# Patient Record
Sex: Female | Born: 2000 | Race: Asian | Hispanic: No | Marital: Married | Smoking: Never smoker
Health system: Southern US, Community
[De-identification: ages and names within clinical notes are randomized; demographics above are authoritative.]

## PROBLEM LIST (undated history)

## (undated) DIAGNOSIS — E538 Deficiency of other specified B group vitamins: Secondary | ICD-10-CM

## (undated) DIAGNOSIS — Z789 Other specified health status: Secondary | ICD-10-CM

## (undated) HISTORY — PX: NO PAST SURGERIES: SHX2092

---

## 2021-02-14 ENCOUNTER — Ambulatory Visit (HOSPITAL_COMMUNITY)
Admission: RE | Admit: 2021-02-14 | Discharge: 2021-02-14 | Disposition: A | Discharge status: Home / Self Care | Payer: 59 | Source: Ambulatory Visit | Attending: Emergency Medicine | Admitting: Emergency Medicine

## 2021-02-14 ENCOUNTER — Other Ambulatory Visit: Payer: Self-pay

## 2021-02-14 ENCOUNTER — Encounter (HOSPITAL_COMMUNITY): Payer: Self-pay

## 2021-02-14 VITALS — BP 119/76 | HR 93 | Temp 98.8°F | Resp 17

## 2021-02-14 DIAGNOSIS — Z3201 Encounter for pregnancy test, result positive: Secondary | ICD-10-CM

## 2021-02-14 LAB — POCT URINALYSIS DIPSTICK, ED / UC
Bilirubin Urine: NEGATIVE
Glucose, UA: NEGATIVE mg/dL
Hgb urine dipstick: NEGATIVE
Ketones, ur: NEGATIVE mg/dL
Leukocytes,Ua: NEGATIVE
Nitrite: NEGATIVE
Protein, ur: NEGATIVE mg/dL
Specific Gravity, Urine: <=1.005 (ref 1.005–1.030)
Urobilinogen, UA: 0.2 mg/dL (ref 0.0–1.0)
pH: 5 (ref 5.0–8.0)

## 2021-02-14 LAB — POC URINE PREG, ED: Preg Test, Ur: POSITIVE — AB

## 2021-02-14 MED ORDER — PRENATAL COMPLETE 14-0.4 MG PO TABS: 1.0000 | ORAL_TABLET | Freq: Every day | ORAL | 0 refills | Status: DC

## 2021-02-14 NOTE — ED Triage Notes (Signed)
Pt reports that when she urinates has some bubbles that will last about 5-6 seconds and go away. Thinks might have protein in urine. Has some odor. Reports trying to conceive. Denies dysuria or blood in urine. Has little abd cramping.  Having trouble sleeping at night also

## 2021-02-14 NOTE — Discharge Instructions (Addendum)
Take the prenatal vitamin daily.   Do not take any ibuprofen (advil), naproxen (aleve), or aspirin.   Follow up with an OB/GYN as soon as possible.  They will likely schedule an appointment for around 8 weeks.

## 2021-02-14 NOTE — ED Provider Notes (Signed)
[facility]    CSN: [id number] Arrival date & time: 02/14/21  1656      History   Chief Complaint Chief Complaint  Patient presents with   appointment 1700    HPI Yolanda Valdez is a 20 y.o. female.   Patient here for evaluation due to concern for possible UTI.  Reports noticing some bubbles in urine as well as an odor.  Denies any teary, urgency, or frequency.  Denies any hematuria.  Also reports trying to conceive and last menstrual period was September 21.  Denies any trauma, injury, or other precipitating event.  Denies any specific alleviating or aggravating factors.  Denies any fevers, chest pain, shortness of breath, N/V/D, numbness, tingling, weakness, abdominal pain, or headaches.    The history is provided by the patient and the spouse.   History reviewed. No pertinent past medical history.  There are no problems to display for this patient.   History reviewed. No pertinent surgical history.  OB History   No obstetric history on file.      Home Medications    Prior to Admission medications   Medication Sig Start Date End Date Taking? Authorizing Provider  Prenatal Vit-Fe Fumarate-FA (PRENATAL COMPLETE) 14-0.4 MG TABS Take 1 tablet by mouth daily. 02/14/21  Yes Ivette Loyal, NP    Family History No family history on file.  Social History Social History   Tobacco Use   Smoking status: Never   Smokeless tobacco: Never  Substance Use Topics   Alcohol use: Not Currently   Drug use: Not Currently     Allergies   Patient has no known allergies.   Review of Systems Review of Systems  All other systems reviewed and are negative.   Physical Exam Triage Vital Signs ED Triage Vitals  Enc Vitals Group     BP 02/14/21 1720 119/76     Pulse Rate 02/14/21 1720 93     Resp 02/14/21 1720 17     Temp 02/14/21 1720 98.8 F (37.1 C)     Temp Source 02/14/21 1720 Oral     SpO2 02/14/21 1720 100 %     Weight --      Height --      Head  Circumference --      Peak Flow --      Pain Score 02/14/21 1717 4     Pain Loc --      Pain Edu? --      Excl. in GC? --    No data found.  Updated Vital Signs BP 119/76 (BP Location: Right Arm)   Pulse 93   Temp 98.8 F (37.1 C) (Oral)   Resp 17   LMP 01/12/2021   SpO2 100%   Visual Acuity Right Eye Distance:   Left Eye Distance:   Bilateral Distance:    Right Eye Near:   Left Eye Near:    Bilateral Near:     Physical Exam Vitals and nursing note reviewed.  Constitutional:      General: She is not in acute distress.    Appearance: Normal appearance. She is not ill-appearing, toxic-appearing or diaphoretic.  HENT:     Head: Normocephalic and atraumatic.  Eyes:     Conjunctiva/sclera: Conjunctivae normal.  Cardiovascular:     Rate and Rhythm: Normal rate.     Pulses: Normal pulses.  Pulmonary:     Effort: Pulmonary effort is normal.  Abdominal:     General: Abdomen is flat.  Tenderness: There is no right CVA tenderness or left CVA tenderness.  Musculoskeletal:        General: Normal range of motion.     Cervical back: Normal range of motion.  Skin:    General: Skin is warm and dry.  Neurological:     General: No focal deficit present.     Mental Status: She is alert and oriented to person, place, and time.  Psychiatric:        Mood and Affect: Mood normal.     UC Treatments / Results  Labs (all labs ordered are listed, but only abnormal results are displayed) Labs Reviewed  POC URINE PREG, ED - Abnormal; Notable for the following components:      Result Value   Preg Test, Ur POSITIVE (*)    All other components within normal limits  POCT URINALYSIS DIPSTICK, ED / UC    EKG   Radiology No results found.  Procedures Procedures (including critical care time)  Medications Ordered in UC Medications - No data to display  Initial Impression / Assessment and Plan / UC Course  I have reviewed the triage vital signs and the nursing  notes.  Pertinent labs & imaging results that were available during my care of the patient were reviewed by me and considered in my medical decision making (see chart for details).    Assessment negative for red flags or concerns.  Urinalysis within normal limits with no signs of infection.  Urine pregnancy test positive.  Prescribed daily prenatal vitamins.  Patient given sheet on medications that are safe to take during pregnancy.  Follow-up with OB/GYN as soon as possible for reevaluation. Final Clinical Impressions(s) / UC Diagnoses   Final diagnoses:  Positive pregnancy test     Discharge Instructions      Take the prenatal vitamin daily.   Do not take any ibuprofen (advil), naproxen (aleve), or aspirin.   Follow up with an OB/GYN as soon as possible.  They will likely schedule an appointment for around 8 weeks.       ED Prescriptions     Medication Sig Dispense Auth. Provider   Prenatal Vit-Fe Fumarate-FA (PRENATAL COMPLETE) 14-0.4 MG TABS Take 1 tablet by mouth daily. 60 tablet Ivette Loyal, NP      PDMP not reviewed this encounter.   Ivette Loyal, NP 02/14/21 1754

## 2021-03-15 ENCOUNTER — Ambulatory Visit (INDEPENDENT_AMBULATORY_CARE_PROVIDER_SITE_OTHER): Payer: 59

## 2021-03-15 ENCOUNTER — Other Ambulatory Visit: Payer: Self-pay

## 2021-03-15 VITALS — BP 110/65 | HR 77 | Ht 64.0 in | Wt 147.4 lb

## 2021-03-15 DIAGNOSIS — Z3400 Encounter for supervision of normal first pregnancy, unspecified trimester: Secondary | ICD-10-CM | POA: Insufficient documentation

## 2021-03-15 DIAGNOSIS — Z3401 Encounter for supervision of normal first pregnancy, first trimester: Secondary | ICD-10-CM

## 2021-03-15 DIAGNOSIS — O3680X0 Pregnancy with inconclusive fetal viability, not applicable or unspecified: Secondary | ICD-10-CM

## 2021-03-15 DIAGNOSIS — Z3A08 8 weeks gestation of pregnancy: Secondary | ICD-10-CM

## 2021-03-15 NOTE — Progress Notes (Signed)
New OB Intake  I connected with  Yolanda Valdez on 03/15/21 at  8:15 AM EST by in person and verified that I am speaking with the correct person using two identifiers. Nurse is located at [facility] and pt is located at [place].  I discussed the limitations, risks, security and privacy concerns of performing an evaluation and management service by telephone and the availability of in person appointments. I also discussed with the patient that there may be a patient responsible charge related to this service. The patient expressed understanding and agreed to proceed.  I explained I am completing New OB Intake today. We discussed her EDD of 10/19/21 that is based on LMP of 01/12/21. Pt is G1/P0. I reviewed her allergies, medications, Medical/Surgical/OB history, and appropriate screenings. I informed her of [facility] services. Based on history, this is a/an  pregnancy uncomplicated .   There are no problems to display for this patient.   Concerns addressed today  Delivery Plans:  Plans to deliver at [facility].   MyChart/Babyscripts MyChart access verified. I explained pt will have some visits in office and some virtually. Babyscripts instructions given and order placed. Patient verifies receipt of registration text/e-mail. Account successfully created and app downloaded.  Blood Pressure Cuff  Patient has private insurance; instructed to purchase blood pressure cuff and bring to first prenatal appt. Explained after first prenatal appt pt will check weekly and document in Babyscripts.  Weight scale: Patient does have weight scale at home already.  Anatomy US Explained first scheduled [place] will be around 19 weeks. Dating and viability scan performed today.  Labs Discussed Avelina Laine genetic screening with patient. Would like both Panorama and Horizon drawn at new OB visit. Routine prenatal labs needed.  Covid Vaccine Patient has covid vaccine.    Informed patient of [facility] website  and placed  link in her AVS.   Social Determinants of Health Food Insecurity: Patient denies food insecurity. WIC Referral: Patient is interested in referral to [facility].  Transportation: Patient denies transportation needs. Childcare: Discussed no children allowed at ultrasound appointments. Offered childcare services; patient declines childcare services at this time.  Send link to Pregnancy Navigators   Placed OB Box on problem list and updated  First visit review I reviewed new OB appt with pt. I explained she will have a pelvic exam, ob bloodwork with genetic screening, and PAP smear. Explained pt will be seen by Donia Ast at first visit; encounter routed to appropriate provider. Explained that patient will be seen by pregnancy navigator following visit with provider. [facility] information placed in AVS.   Hamilton Capri, RN 03/15/2021  8:13 AM

## 2021-03-22 ENCOUNTER — Ambulatory Visit (INDEPENDENT_AMBULATORY_CARE_PROVIDER_SITE_OTHER): Payer: 59 | Admitting: Family Medicine

## 2021-03-22 ENCOUNTER — Encounter: Payer: Self-pay | Admitting: Family Medicine

## 2021-03-22 ENCOUNTER — Other Ambulatory Visit: Payer: Self-pay

## 2021-03-22 VITALS — BP 94/62 | HR 83 | Temp 98.2°F | Ht 63.39 in | Wt 147.8 lb

## 2021-03-22 DIAGNOSIS — Z0000 Encounter for general adult medical examination without abnormal findings: Secondary | ICD-10-CM | POA: Diagnosis not present

## 2021-03-22 DIAGNOSIS — Z23 Encounter for immunization: Secondary | ICD-10-CM | POA: Diagnosis not present

## 2021-03-22 DIAGNOSIS — Z0001 Encounter for general adult medical examination with abnormal findings: Secondary | ICD-10-CM

## 2021-03-22 NOTE — Patient Instructions (Signed)
It was very nice to see you today!  Congratulations on your pregnancy!   We will give you a flu shot today.  We will see you back in year for your next physical.  Please come back sooner if needed.  Take care, Dr Jerline Pain  PLEASE NOTE:  If you had any lab tests please let us know if you have not heard back within a few days. You may see your results on mychart before we have a chance to review them but we will give you a call once they are reviewed by [place]. If we ordered any referrals today, please let us know if you have not heard from their office within the next week.   Please try these tips to maintain a healthy lifestyle:  Eat at least 3 REAL meals and 1-2 snacks per day.  Aim for no more than 5 hours between eating.  If you eat breakfast, please do so within one hour of getting up.   Each meal should contain half fruits/vegetables, one quarter protein, and one quarter carbs (no bigger than a computer mouse)  Cut down on sweet beverages. This includes juice, soda, and sweet tea.   Drink at least 1 glass of water with each meal and aim for at least 8 glasses per day  Exercise at least 150 minutes every week.    Preventive Care 81-48 Years Old, Female Preventive care refers to lifestyle choices and visits with your health care provider that can promote health and wellness. Preventive care visits are also called wellness exams. What can I expect for my preventive care visit? Counseling During your preventive care visit, your health care provider may ask about your: Medical history, including: Past medical problems. Family medical history. Pregnancy history. Current health, including: Menstrual cycle. Method of birth control. Emotional well-being. Home life and relationship well-being. Sexual activity and sexual health. Lifestyle, including: Alcohol, nicotine or tobacco, and drug use. Access to firearms. Diet, exercise, and sleep habits. Work and work  Statistician. Sunscreen use. Safety issues such as seatbelt and bike helmet use. Physical exam Your health care provider may check your: Height and weight. These may be used to calculate your BMI (body mass index). BMI is a measurement that tells if you are at a healthy weight. Waist circumference. This measures the distance around your waistline. This measurement also tells if you are at a healthy weight and may help predict your risk of certain diseases, such as type 2 diabetes and high blood pressure. Heart rate and blood pressure. Body temperature. Skin for abnormal spots. What immunizations do I need? Vaccines are usually given at various ages, according to a schedule. Your health care provider will recommend vaccines for you based on your age, medical history, and lifestyle or other factors, such as travel or where you work. What tests do I need? Screening Your health care provider may recommend screening tests for certain conditions. This may include: Pelvic exam and Pap test. Lipid and cholesterol levels. Diabetes screening. This is done by checking your blood sugar (glucose) after you have not eaten for a while (fasting). Hepatitis B test. Hepatitis C test. HIV (human immunodeficiency virus) test. STI (sexually transmitted infection) testing, if you are at risk. BRCA-related cancer screening. This may be done if you have a family history of breast, ovarian, tubal, or peritoneal cancers. Talk with your health care provider about your test results, treatment options, and if necessary, the need for more tests. Follow these instructions at home: Eating and  drinking  Eat a healthy diet that includes fresh fruits and vegetables, whole grains, lean protein, and low-fat dairy products. Take vitamin and mineral supplements as recommended by your health care provider. Do not drink alcohol if: Your health care provider tells you not to drink. You are pregnant, may be pregnant, or are  planning to become pregnant. If you drink alcohol: Limit how much you have to 0-1 drink a day. Know how much alcohol is in your drink. In the [place] one drink equals one 12 oz bottle of beer (355 mL), one 5 oz glass of wine (148 mL), or one 1 oz glass of hard liquor (44 mL). Lifestyle Brush your teeth every morning and night with fluoride toothpaste. Floss one time each day. Exercise for at least 30 minutes 5 or more days each week. Do not use any products that contain nicotine or tobacco. These products include cigarettes, chewing tobacco, and vaping devices, such as e-cigarettes. If you need help quitting, ask your health care provider. Do not use drugs. If you are sexually active, practice safe sex. Use a condom or other form of protection to prevent STIs. If you do not wish to become pregnant, use a form of birth control. If you plan to become pregnant, see your health care provider for a prepregnancy visit. Find healthy ways to manage stress, such as: Meditation, yoga, or listening to music. Journaling. Talking to a trusted person. Spending time with friends and family. Minimize exposure to UV radiation to reduce your risk of skin cancer. Safety Always wear your seat belt while driving or riding in a vehicle. Do not drive: If you have been drinking alcohol. Do not ride with someone who has been drinking. If you have been using any mind-altering substances or drugs. While texting. When you are tired or distracted. Wear a helmet and other protective equipment during sports activities. If you have firearms in your house, make sure you follow all gun safety procedures. Seek help if you have been physically or sexually abused. What's next? Go to your health care provider once a year for an annual wellness visit. Ask your health care provider how often you should have your eyes and teeth checked. Stay up to date on all vaccines. This information is not intended to replace advice given  to you by your health care provider. Make sure you discuss any questions you have with your health care provider. Document Revised: 10/06/2020 Document Reviewed: 10/06/2020 Elsevier Patient Education  [place].

## 2021-03-22 NOTE — Progress Notes (Signed)
Chief Complaint:  Yolanda Valdez is a 20 y.o. female who presents today for her annual comprehensive physical exam.  She is a new patient.   Assessment/Plan:  Pregnant Established with OB/GYN.  She is about [redacted] weeks pregnant.  Preventative Healthcare: Flu shot given today. She will follow up with OBGYN for prenatal and preventative healthcare.   Patient Counseling(The following topics were reviewed and/or handout was given):  -Nutrition: Stressed importance of moderation in sodium/caffeine intake, saturated fat and cholesterol, caloric balance, sufficient intake of fresh fruits, vegetables, and fiber.  -Stressed the importance of regular exercise.   -Substance Abuse: Discussed cessation/primary prevention of tobacco, alcohol, or other drug use; driving or other dangerous activities under the influence; availability of treatment for abuse.   -Injury prevention: Discussed safety belts, safety helmets, smoke detector, smoking near bedding or upholstery.   -Sexuality: Discussed sexually transmitted diseases, partner selection, use of condoms, avoidance of unintended pregnancy and contraceptive alternatives.   -Dental health: Discussed importance of regular tooth brushing, flossing, and dental visits.  -Health maintenance and immunizations reviewed. Please refer to Health maintenance section.  Return to care in 1 year for next preventative visit.     Subjective:  HPI:  She has no acute complaints today. She is a new patient  Lifestyle Diet: Balanced. Plenty of fruits and vegetables.  Exercise: REgular.   No flowsheet data found.  Health Maintenance Due  Topic Date Due   HPV VACCINES (1 - 2-dose series) Never done   HIV Screening  Never done   Hepatitis C Screening  Never done   TETANUS/TDAP  Never done   INFLUENZA VACCINE  Never done   PAP-Cervical Cytology Screening  Never done   PAP SMEAR-Modifier  Never done     ROS: Per HPI, otherwise a complete review of systems was  negative.   PMH:  The following were reviewed and entered/updated in epic: History reviewed. No pertinent past medical history. There are no problems to display for this patient.  History reviewed. No pertinent surgical history.  Family History  Problem Relation Age of Onset   Hypertension Mother    Diabetes type II Mother     Medications- reviewed and updated Current Outpatient Medications  Medication Sig Dispense Refill   Prenatal Vit-Fe Fumarate-FA (PRENATAL VITAMINS) 28-0.8 MG TABS Take by mouth.     No current facility-administered medications for this visit.    Allergies-reviewed and updated No Known Allergies  Social History   Socioeconomic History   Marital status: Married    Spouse name: Not on file   Number of children: Not on file   Years of education: Not on file   Highest education level: Not on file  Occupational History   Not on file  Tobacco Use   Smoking status: Never   Smokeless tobacco: Never  Vaping Use   Vaping Use: Never used  Substance and Sexual Activity   Alcohol use: Never   Drug use: Never   Sexual activity: Yes  Other Topics Concern   Not on file  Social History Narrative   Not on file   Social Determinants of Health   Financial Resource Strain: Not on file  Food Insecurity: Not on file  Transportation Needs: Not on file  Physical Activity: Not on file  Stress: Not on file  Social Connections: Not on file        Objective:  Physical Exam: BP 94/62   Pulse 83   Temp 98.2 F (36.8 C) (Temporal)  Ht 5' 3.39" (1.61 m)   Wt 147 lb 12.8 oz (67 kg)   SpO2 98%   BMI 25.86 kg/m   Body mass index is 25.86 kg/m. Wt Readings from Last 3 Encounters:  03/22/21 147 lb 12.8 oz (67 kg)   Gen: NAD, resting comfortably HEENT: TMs normal bilaterally. OP clear. No thyromegaly noted.  CV: RRR with no murmurs appreciated Pulm: NWOB, CTAB with no crackles, wheezes, or rhonchi GI: Normal bowel sounds present. Soft, Nontender,  Nondistended. MSK: no edema, cyanosis, or clubbing noted Skin: warm, dry Neuro: CN2-12 grossly intact. Strength 5/5 in upper and lower extremities. Reflexes symmetric and intact bilaterally.  Psych: Normal affect and thought content     Yolanda Valdez M. [name], MD 03/22/2021 10:43 AM

## 2021-03-23 ENCOUNTER — Encounter: Payer: Self-pay | Admitting: Family Medicine

## 2021-03-30 ENCOUNTER — Ambulatory Visit (INDEPENDENT_AMBULATORY_CARE_PROVIDER_SITE_OTHER): Payer: 59 | Admitting: Family Medicine

## 2021-03-30 ENCOUNTER — Encounter: Payer: Self-pay | Admitting: Family Medicine

## 2021-03-30 ENCOUNTER — Other Ambulatory Visit: Payer: Self-pay

## 2021-03-30 ENCOUNTER — Other Ambulatory Visit (HOSPITAL_COMMUNITY)
Admission: RE | Admit: 2021-03-30 | Discharge: 2021-03-30 | Disposition: A | Discharge status: Home / Self Care | Payer: 59 | Source: Ambulatory Visit | Attending: Women's Health | Admitting: Women's Health

## 2021-03-30 VITALS — BP 119/67 | HR 71 | Wt 148.0 lb

## 2021-03-30 DIAGNOSIS — Z3401 Encounter for supervision of normal first pregnancy, first trimester: Secondary | ICD-10-CM | POA: Diagnosis present

## 2021-03-30 NOTE — Progress Notes (Signed)
Subjective:   Yolanda Valdez is a 20 y.o. G1P0000 at [redacted]w[redacted]d by LMP being seen today for her first obstetrical visit.  She has no significant obstetric history thus far. She denies any chronic medical problems and is not taking any other medications outside of her prenatal vitamin and Unisom. She has not had any prior surgeries. She reports having normal periods prior to pregnancy. Patient does intend to breast feed.    Patient reports some low back aches and leg cramps recently. She is wanting advice on how to help with this today. She reports some mild nausea but this is mild and intermittent. She denies dysuria, vaginal bleeding, pelvic pain, or LOF. She denies abnormal vaginal discharge. She is eating and drinking well. Her and her partner are very excited about her pregnancy. They are requesting genetic testing to hopefully find out the gender of the baby early.  HISTORY: OB History  Gravida Para Term Preterm AB Living  1 0 0 0 0 0  SAB IAB Ectopic Multiple Live Births  0 0 0 0 0    # Outcome Date GA Lbr Len/2nd Weight Sex Delivery Anes PTL Lv  1 Current            Pap smear: Never completed; will complete today.  History reviewed. No pertinent past medical history.  History reviewed. No pertinent surgical history.  Family History  Problem Relation Age of Onset   Hypertension Mother    Diabetes type II Mother    Diabetes Mother    Hypertension Paternal Grandfather    Diabetes Paternal Grandfather    Social History   Tobacco Use   Smoking status: Never   Smokeless tobacco: Never  Vaping Use   Vaping Use: Never used  Substance Use Topics   Alcohol use: Never   Drug use: Never   No Known Allergies Current Outpatient Medications on File Prior to Visit  Medication Sig Dispense Refill   Prenatal Vit-Fe Fumarate-FA (PRENATAL VITAMINS) 28-0.8 MG TABS Take by mouth.     Prenatal Vit-Fe Fumarate-FA (PRENATAL COMPLETE) 14-0.4 MG TABS Take 1 tablet by mouth daily. 60 tablet 0    No current facility-administered medications on file prior to visit.   Exam   Vitals:   03/30/21 1419  BP: 119/67  Pulse: 71  Weight: 148 lb (67.1 kg)   Fetal Heart Rate (bpm): 170  Pelvic Exam: Perineum: no hemorrhoids, normal perineum   Vulva: normal external genitalia, no lesions   Vagina:  normal mucosa, normal discharge   Cervix: no lesions and normal, pap smear done  System: General: well-developed, well-nourished female in no acute distress   Skin: normal coloration and turgor, no rashes   Neurologic: oriented, normal, negative, normal mood   Extremities: normal ROM, no LE edema    HEENT extraocular movement intact, conjunctivae clear, MMM   Cardiovascular: normal rate, warm and well perfused    Respiratory:  normal work of breathing on room air    Abdomen: soft, non-tender     Assessment:   Pregnancy: G1P0000 Patient Active Problem List   Diagnosis Date Noted   Supervision of normal first pregnancy 03/15/2021   Plan:   1. Encounter for supervision of normal first pregnancy in first trimester Progressing well thus far in pregnancy. FHT, vital signs, and exam as above normal today. Recommended stretches and massage for backaches. Encouraged hydration and electrolyte repletion for leg cramps. Discussed that anemia can also cause this. Will check CBC on initial prenatal labs  as noted below. Pap smear obtained. Will follow up results. Anatomy US ordered. Genetic testing discussed and ordered. Obstetric precautions reviewed. Discussed foods and medications to avoid in pregnancy. Additional information provided in AVS. Follow up for next prenatal visit in 4 weeks, sooner as needed. - Cytology - PAP( [place]) - Cervicovaginal ancillary only( [place]) - Obstetric Panel, Including HIV - Hepatitis C antibody - Culture, OB Urine - Genetic Screening - US MFM OB COMP + 14 WK; Future  Initial labs drawn. Continue prenatal vitamins. Genetic Screening discussed. NIPS  ordered today. Ultrasound discussed; fetal anatomic survey: ordered. Problem list reviewed and updated. The nature of [facility] [facility] with multiple MDs and other Advanced Practice Providers was explained to patient; also emphasized that residents, students are part of our team. Routine obstetric precautions reviewed.  Return in about 4 weeks (around 04/27/2021) for follow up LR OB visit.  Evalina Field, MD  OB Fellow  Faculty Practice

## 2021-03-30 NOTE — Progress Notes (Signed)
Pt states she is having some low back and leg pain.

## 2021-03-31 LAB — OBSTETRIC PANEL, INCLUDING HIV
Antibody Screen: NEGATIVE
Basophils Absolute: 0.1 10*3/uL (ref 0.0–0.2)
Basos: 1 %
EOS (ABSOLUTE): 0.2 10*3/uL (ref 0.0–0.4)
Eos: 2 %
HIV Screen 4th Generation wRfx: NONREACTIVE
Hematocrit: 42 % (ref 34.0–46.6)
Hemoglobin: 13.7 g/dL (ref 11.1–15.9)
Hepatitis B Surface Ag: NEGATIVE
Immature Grans (Abs): 0 10*3/uL (ref 0.0–0.1)
Immature Granulocytes: 0 %
Lymphocytes Absolute: 2.5 10*3/uL (ref 0.7–3.1)
Lymphs: 30 %
MCH: 27.3 pg (ref 26.6–33.0)
MCHC: 32.6 g/dL (ref 31.5–35.7)
MCV: 84 fL (ref 79–97)
Monocytes Absolute: 0.7 10*3/uL (ref 0.1–0.9)
Monocytes: 8 %
Neutrophils Absolute: 5.1 10*3/uL (ref 1.4–7.0)
Neutrophils: 59 %
Platelets: 243 10*3/uL (ref 150–450)
RBC: 5.01 x10E6/uL (ref 3.77–5.28)
RDW: 18.4 % — ABNORMAL HIGH (ref 11.7–15.4)
RPR Ser Ql: NONREACTIVE
Rh Factor: POSITIVE
Rubella Antibodies, IGG: 13.4 index (ref >0.99)
WBC: 8.4 10*3/uL (ref 3.4–10.8)

## 2021-03-31 LAB — CERVICOVAGINAL ANCILLARY ONLY
Bacterial Vaginitis (gardnerella): NEGATIVE
Candida Glabrata: NEGATIVE
Candida Vaginitis: POSITIVE — AB
Chlamydia: NEGATIVE
Comment: NEGATIVE
Comment: NEGATIVE
Comment: NEGATIVE
Comment: NEGATIVE
Comment: NEGATIVE
Comment: NORMAL
Neisseria Gonorrhea: NEGATIVE
Trichomonas: NEGATIVE

## 2021-03-31 LAB — HEPATITIS C ANTIBODY: Hep C Virus Ab: 0.3 s/co ratio (ref 0.0–0.9)

## 2021-04-01 LAB — CYTOLOGY - PAP

## 2021-04-01 LAB — URINE CULTURE, OB REFLEX

## 2021-04-01 LAB — CULTURE, OB URINE

## 2021-04-04 ENCOUNTER — Telehealth: Payer: Self-pay

## 2021-04-04 NOTE — Telephone Encounter (Signed)
Pt called for test results from initial ob appointment last week. 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 Per guidelines, she will need repeat pap smear in 1 year. Also she has a yeast infection, she can treat OTC with monistat if symptomatic, if not, then does not need treatment. Advised genetic screening should come back within the next 7-10 business days. Pt agreed and verbalized understanding.

## 2021-04-11 ENCOUNTER — Encounter: Payer: Self-pay | Admitting: Obstetrics

## 2021-04-27 ENCOUNTER — Other Ambulatory Visit: Payer: Self-pay

## 2021-04-27 ENCOUNTER — Ambulatory Visit (INDEPENDENT_AMBULATORY_CARE_PROVIDER_SITE_OTHER): Payer: 59 | Admitting: Women's Health

## 2021-04-27 VITALS — BP 123/70 | HR 103 | Wt 154.0 lb

## 2021-04-27 DIAGNOSIS — O219 Vomiting of pregnancy, unspecified: Secondary | ICD-10-CM | POA: Insufficient documentation

## 2021-04-27 DIAGNOSIS — B3731 Acute candidiasis of vulva and vagina: Secondary | ICD-10-CM | POA: Insufficient documentation

## 2021-04-27 DIAGNOSIS — Z3402 Encounter for supervision of normal first pregnancy, second trimester: Secondary | ICD-10-CM

## 2021-04-27 DIAGNOSIS — Z3A15 15 weeks gestation of pregnancy: Secondary | ICD-10-CM

## 2021-04-27 MED ORDER — TERCONAZOLE 0.4 % VA CREA: 1.0000 | TOPICAL_CREAM | Freq: Every day | VAGINAL | 0 refills | Status: AC

## 2021-04-27 MED ORDER — ONDANSETRON HCL 4 MG PO TABS: 4.0000 mg | ORAL_TABLET | Freq: Three times a day (TID) | ORAL | 0 refills | Status: DC | PRN

## 2021-04-27 MED ORDER — DOXYLAMINE-PYRIDOXINE 10-10 MG PO TBEC: 2.0000 | DELAYED_RELEASE_TABLET | Freq: Every day | ORAL | 2 refills | Status: DC

## 2021-04-27 NOTE — Patient Instructions (Addendum)
Maternity Assessment Unit (MAU) ° °The Maternity Assessment Unit (MAU) is located at the [facility]. The address is: [address], [address], [place], [place] [address]. Please see map below for additional directions. ° ° ° °The Maternity Assessment Unit is designed to help you during your pregnancy, and for up to 6 weeks after delivery, with any pregnancy- or postpartum-related emergencies, if you think you are in labor, or if your water has broken. For example, if you experience nausea and vomiting, vaginal bleeding, severe abdominal or pelvic pain, elevated blood pressure or other problems related to your pregnancy or postpartum time, please come to the Maternity Assessment Unit for assistance. ° ° ° ° ° ° °AREA PEDIATRIC/FAMILY PRACTICE PHYSICIANS ° °[facility] °[address] °[address] °[place], [place] [address] °Phone - [phone]   Fax - [phone] ° °[facility] °[address] °[place], [place]  [address] °Phone - [phone]   Fax - [phone] ° °[facility] °[address] °[place], [place]  [address] °Phone - [phone]   Fax - [phone] ° °[facility] °[address] °[place], [place]  [address] °Phone - [phone]   Fax - [phone] ° °[facility] °[address] °[place], [place]  [address] °Phone - [phone]   Fax - [phone] ° °[facility] °[address] °[place], [place]  [address] °Phone - [phone]   Fax - [phone] ° °[facility] °[address] °[place], [place]  [address] °Phone - [phone]   Fax - [phone] ° °[facility] °[address] °[place], [place]  [address] °Phone - [phone]   Fax - [phone] ° °[facility] °[address] °[place], [place]  [address] °Phone - [phone]   Fax -  [phone] °[facility] °[address] °[place], [place]  [address] °Phone - [phone]   Fax - [phone] ° °[facility] °[address] °[place], [place]  [address] °Phone - [phone]   Fax - [phone] ° °[facility] °[address] °[place], [place]  [address] °Phone - [phone]   Fax - [phone] ° °[facility] °[address] °[place], [place]  [address] °Phone - [phone]   Fax - [phone] ° °[facility] °[address] °[place], [place]  [address] °Phone - [phone] ° °[facility] °[address] °[place], [place]  [address] °Phone - [phone]   Fax - [phone] ° °[facility] °[address] °[place], [place]  [address] °Phone - [phone]   Fax - [phone] ° °[facility] °[address] °[place], [place]  [address] °Phone - [phone]   Fax - [phone] ° °[facility] °[address] °[place], [place]  [address] °Phone - [phone]   Fax - [phone] ° ° °[facility] °[address] °[place], [place]  [address] °Phone - [phone]   Fax - [phone] ° °[facility] °[address] °, [place]  [address] °Phone - [phone]   Fax - [phone] ° °[facility] °[address] °, [place]  [address] °Phone - [phone]   Fax - [phone] ° °[facility] °[address] °, [place]  [address] °Phone - [phone]     Fax - [phone]  [facility] [address] [place], [place]  [address] Phone - [phone]   Fax - [phone] Gerarda Fraction [phone] [place], [place]  [address] Phone - [phone]   Fax - [phone]  [facility] [address] [place], [place]  [address] Phone - [phone]   Fax - [phone]  [facility] [place] [address] [place], [place]  [address] Phone - [phone]   Fax - [phone]        Childbirth Education Options: [facility] Department Classes:  Childbirth education classes can help you get ready for a positive parenting experience. You can also meet other expectant parents and get free stuff for your baby. Each class runs for five weeks on the same night and costs $45 for the mother-to-be and her support person. Medicaid covers the cost if you are eligible. Call [phone] to register. [facility] Childbirth Education: Classes can vary in availability and schedule is subject to change. For most up-to-date information please visit [email or web address] to review and register.          For nausea and vomiting: Unisom 12.5 mg at bedtime Vitamin B6 25mg  at bedtime and in the morning.

## 2021-04-27 NOTE — Progress Notes (Signed)
Declined AFP 

## 2021-04-27 NOTE — Progress Notes (Signed)
Subjective:  Yolanda Valdez is a 21 y.o. G1P0000 at [redacted]w[redacted]d being seen today for ongoing prenatal care.  She is currently monitored for the following issues for this low-risk pregnancy and has Supervision of normal first pregnancy; Nausea and vomiting in pregnancy; and Vaginal yeast infection on their problem list.  Patient reports nausea, vaginal irritation, and vomiting.  Contractions: Not present. Vag. Bleeding: None.   . Denies leaking of fluid.   The following portions of the patient's history were reviewed and updated as appropriate: allergies, current medications, past family history, past medical history, past social history, past surgical history and problem list. Problem list updated.  Objective:   Vitals:   04/27/21 0901  BP: 123/70  Pulse: (!) 103  Weight: 154 lb (69.9 kg)    Fetal Status: Fetal Heart Rate (bpm): 156         General:  Alert, oriented and cooperative. Patient is in no acute distress.  Skin: Skin is warm and dry. No rash noted.   Cardiovascular: Normal heart rate noted  Respiratory: Normal respiratory effort, no problems with respiration noted  Abdomen: Soft, gravid, appropriate for gestational age. Pain/Pressure: Absent     Pelvic: Vag. Bleeding: None     Cervical exam deferred        Extremities: Normal range of motion.     Mental Status: Normal mood and affect. Normal behavior. Normal judgment and thought content.   Urinalysis:      Assessment and Plan:  Pregnancy: G1P0000 at [redacted]w[redacted]d  1. Encounter for supervision of normal first pregnancy in second trimester -contraception info given -peds list given -CBE info given -Diagnosed with vaginal yeast via swab in office. Used Monistat OTC without resolution of symptoms. Terconzole sent. -Nausea and Vomiting: Patient reports every 2 days will experience multiple episodes of vomiting. No issues between vomiting episodes. Has been using Unisom and B6 PRN. Discussed those are not for PRN use. Sent Diclegis for  maintenance/prevention and Zofran for PRN use. Discussed goal is resolution/prevention of most vomiting, may not relieve nausea. -sister-in-law present for entire visit  PHQ9 SCORE ONLY 03/15/2021  PHQ-9 Total Score 0   GAD 7 : Generalized Anxiety Score 03/15/2021  Nervous, Anxious, on Edge 0  Control/stop worrying 0  Worry too much - different things 0  Trouble relaxing 0  Restless 0  Easily annoyed or irritable 1  Afraid - awful might happen 1  Total GAD 7 Score 2   2. [redacted] weeks gestation of pregnancy  Preterm labor symptoms and general obstetric precautions including but not limited to vaginal bleeding, contractions, leaking of fluid and fetal movement were reviewed in detail with the patient. I discussed the assessment and treatment plan with the patient. The patient was provided an opportunity to ask questions and all were answered. The patient agreed with the plan and demonstrated an understanding of the instructions. The patient was advised to call back or seek an in-person office evaluation/go to MAU at [facility] for any urgent or concerning symptoms. Please refer to After Visit Summary for other counseling recommendations.  Return in about 5 weeks (around 06/01/2021) for in-person or virtual LOB/APP OK.   Timm Bonenberger, Odie Sera, NP

## 2021-05-06 ENCOUNTER — Telehealth: Payer: Self-pay

## 2021-05-06 NOTE — Telephone Encounter (Signed)
Pt reports vaginal "rash" radiating to buttock after completing the Terazol 7 day treatment. Consulted with MD, pt to apply A & D ointment as directed to the area(s). Keep the area clean and pat dry after bathing and restroom visits.  If no relief x 3 days, contact the office for an appt.

## 2021-05-12 ENCOUNTER — Ambulatory Visit (INDEPENDENT_AMBULATORY_CARE_PROVIDER_SITE_OTHER): Payer: 59 | Admitting: Obstetrics

## 2021-05-12 ENCOUNTER — Other Ambulatory Visit: Payer: Self-pay

## 2021-05-12 ENCOUNTER — Other Ambulatory Visit (HOSPITAL_COMMUNITY)
Admission: RE | Admit: 2021-05-12 | Discharge: 2021-05-12 | Disposition: A | Discharge status: Home / Self Care | Payer: 59 | Source: Ambulatory Visit | Attending: Obstetrics | Admitting: Obstetrics

## 2021-05-12 ENCOUNTER — Encounter: Payer: Self-pay | Admitting: Obstetrics

## 2021-05-12 VITALS — BP 112/72 | HR 90 | Wt 151.0 lb

## 2021-05-12 DIAGNOSIS — T7840XA Allergy, unspecified, initial encounter: Secondary | ICD-10-CM

## 2021-05-12 DIAGNOSIS — Z3402 Encounter for supervision of normal first pregnancy, second trimester: Secondary | ICD-10-CM | POA: Diagnosis present

## 2021-05-12 MED ORDER — PREDNISONE 10 MG (21) PO TBPK: ORAL_TABLET | ORAL | 0 refills | Status: DC

## 2021-05-12 NOTE — Progress Notes (Signed)
Pt complains of vaginal itching and rash - severe pain and itching - can't sleep due to problem

## 2021-05-12 NOTE — Progress Notes (Signed)
Subjective:  Yolanda Valdez is a 21 y.o. G1P0000 at [redacted]w[redacted]d being seen today for ongoing prenatal care.  She is currently monitored for the following issues for this low-risk pregnancy and has Supervision of normal first pregnancy; Nausea and vomiting in pregnancy; and Vaginal yeast infection on their problem list.  Patient reports  severe vaginal burning after using Monistat and Terazol for a yeast infection .  Contractions: Not present. Vag. Bleeding: None.  Movement: Present. Denies leaking of fluid.   The following portions of the patient's history were reviewed and updated as appropriate: allergies, current medications, past family history, past medical history, past social history, past surgical history and problem list. Problem list updated.  Objective:   Vitals:   05/12/21 1600  BP: 112/72  Pulse: 90  Weight: 151 lb (68.5 kg)    Fetal Status:     Movement: Present     General:  Alert, oriented and cooperative. Patient is in no acute distress.  Skin: Skin is warm and dry. No rash noted.   Cardiovascular: Normal heart rate noted  Respiratory: Normal respiratory effort, no problems with respiration noted  Abdomen: Soft, gravid, appropriate for gestational age. Pain/Pressure: Absent     Pelvic:  Cervical exam deferred        Extremities: Normal range of motion.     Mental Status: Normal mood and affect. Normal behavior. Normal judgment and thought content.   Urinalysis:      Assessment and Plan:  Pregnancy: G1P0000 at [redacted]w[redacted]d  1. Encounter for supervision of normal first pregnancy in second trimester Rx: - Cervicovaginal ancillary only( [facility])  2. Allergic reaction to drug, initial encounter Rx: - predniSONE (STERAPRED UNI-PAK 21 TAB) 10 MG (21) TBPK tablet; Take as directed.  Dispense: 21 tablet; Refill: 0   Preterm labor symptoms and general obstetric precautions including but not limited to vaginal bleeding, contractions, leaking of fluid and fetal movement were  reviewed in detail with the patient. Please refer to After Visit Summary for other counseling recommendations.   Return in about 4 weeks (around 06/09/2021) for ROB.   Shelly Bombard, MD  05/12/21

## 2021-05-14 ENCOUNTER — Ambulatory Visit (HOSPITAL_COMMUNITY): Payer: Self-pay

## 2021-05-16 LAB — CERVICOVAGINAL ANCILLARY ONLY
Bacterial Vaginitis (gardnerella): NEGATIVE
Candida Glabrata: NEGATIVE
Candida Vaginitis: NEGATIVE
Comment: NEGATIVE
Comment: NEGATIVE
Comment: NEGATIVE

## 2021-05-25 ENCOUNTER — Ambulatory Visit: Payer: 59 | Attending: Family Medicine

## 2021-05-25 ENCOUNTER — Other Ambulatory Visit: Payer: Self-pay

## 2021-05-25 ENCOUNTER — Other Ambulatory Visit: Payer: Self-pay | Admitting: *Deleted

## 2021-05-25 DIAGNOSIS — Z3A19 19 weeks gestation of pregnancy: Secondary | ICD-10-CM

## 2021-05-25 DIAGNOSIS — Z3A18 18 weeks gestation of pregnancy: Secondary | ICD-10-CM | POA: Diagnosis not present

## 2021-05-25 DIAGNOSIS — Z3689 Encounter for other specified antenatal screening: Secondary | ICD-10-CM | POA: Diagnosis not present

## 2021-05-25 DIAGNOSIS — Z3401 Encounter for supervision of normal first pregnancy, first trimester: Secondary | ICD-10-CM

## 2021-05-25 DIAGNOSIS — Z362 Encounter for other antenatal screening follow-up: Secondary | ICD-10-CM

## 2021-05-25 DIAGNOSIS — Z363 Encounter for antenatal screening for malformations: Secondary | ICD-10-CM | POA: Insufficient documentation

## 2021-05-28 ENCOUNTER — Telehealth: Payer: Self-pay | Admitting: Obstetrics and Gynecology

## 2021-05-28 NOTE — Telephone Encounter (Signed)
Pt not feeling good due to foot/heel pain for last couple days.  Took BP this morning and it was 150/60. She denies HA/BV/RUQ pain.   I called her and had her recheck her blood pressure while we were on the phone - recheck was 125/71.   She has an appt on 06/01/21. If heel is still bothering her then, she should let us know.

## 2021-06-01 ENCOUNTER — Encounter: Payer: Self-pay | Admitting: Nurse Practitioner

## 2021-06-01 ENCOUNTER — Other Ambulatory Visit: Payer: Self-pay

## 2021-06-01 ENCOUNTER — Ambulatory Visit (INDEPENDENT_AMBULATORY_CARE_PROVIDER_SITE_OTHER): Payer: 59 | Admitting: Nurse Practitioner

## 2021-06-01 VITALS — BP 119/68 | HR 105 | Wt 154.0 lb

## 2021-06-01 DIAGNOSIS — Z3A20 20 weeks gestation of pregnancy: Secondary | ICD-10-CM

## 2021-06-01 DIAGNOSIS — O219 Vomiting of pregnancy, unspecified: Secondary | ICD-10-CM

## 2021-06-01 DIAGNOSIS — Z3402 Encounter for supervision of normal first pregnancy, second trimester: Secondary | ICD-10-CM

## 2021-06-01 DIAGNOSIS — T7840XA Allergy, unspecified, initial encounter: Secondary | ICD-10-CM

## 2021-06-01 NOTE — Progress Notes (Signed)
° ° °  Subjective:  Yolanda Valdez is a 21 y.o. G1P0000 at [redacted]w[redacted]d being seen today for ongoing prenatal care.  She is currently monitored for the following issues for this low-risk pregnancy and has Supervision of normal first pregnancy; Nausea and vomiting in pregnancy; and Vaginal yeast infection on their problem list.  Patient reports  heel pain .  Contractions: Not present. Vag. Bleeding: None.  Movement: Present. Denies leaking of fluid.   The following portions of the patient's history were reviewed and updated as appropriate: allergies, current medications, past family history, past medical history, past social history, past surgical history and problem list. Problem list updated.  Objective:   Vitals:   06/01/21 0915  BP: 119/68  Pulse: (!) 105  Weight: 154 lb (69.9 kg)    Fetal Status: Fetal Heart Rate (bpm): 154 Fundal Height: 20 cm Movement: Present     General:  Alert, oriented and cooperative. Patient is in no acute distress.  Skin: Skin is warm and dry. No rash noted.   Cardiovascular: Normal heart rate noted  Respiratory: Normal respiratory effort, no problems with respiration noted  Abdomen: Soft, gravid, appropriate for gestational age. Pain/Pressure: Absent     Pelvic:  Cervical exam deferred        Extremities: Normal range of motion.  Edema: None  Mental Status: Normal mood and affect. Normal behavior. Normal judgment and thought content.   Urinalysis:      Assessment and Plan:  Pregnancy: G1P0000 at [redacted]w[redacted]d  1. Encounter for supervision of normal first pregnancy in second trimester Having heel pain periodically, by exam does not meet criteria for plantar fascitis Advised supportive shoes at home and compression knee highs  2. Allergic reaction to drug, initial encounter Was prescribed prednisone for vaginal itching unresponsive to yeast medication Unable to take prednisone for more than 2 days - made her feel strange and did not continue the medication.  Had some  concerns that prednisone was not appropriate for pregnancy.  Reassured that prednisone is used in pregnancy but glad she stopped medication if she was having concerning symptoms.  Vaginal itching has resolved - patient talked to a friend who is a doctor out of town and thinks she might have had a UTI which she treated with cranberry juice and other OTC meds from the pharmacy. No longer has any symptoms Advised to call the office if symptoms begin again.  If thinking she might have a UTI, a urine culture would need to be done.  3. Nausea and vomiting in pregnancy Handling with unisom.  Advised to add Vit B6. Cannot afford Diclegis not covered by her insurance Milder symptoms  4. [redacted] weeks gestation of pregnancy   Preterm labor symptoms and general obstetric precautions including but not limited to vaginal bleeding, contractions, leaking of fluid and fetal movement were reviewed in detail with the patient. Please refer to After Visit Summary for other counseling recommendations.  Return in about 4 weeks (around 06/29/2021) for in person ROB.  Earlie Server, RN, MSN, NP-BC Nurse Practitioner, [facility] for [employer] [place] [facility] 06/01/2021 9:56 AM

## 2021-06-01 NOTE — Patient Instructions (Signed)
[email or web address] for childbirth and breastfeeding classes °

## 2021-06-20 ENCOUNTER — Other Ambulatory Visit: Payer: Self-pay

## 2021-06-20 ENCOUNTER — Encounter: Payer: Self-pay | Admitting: *Deleted

## 2021-06-20 ENCOUNTER — Ambulatory Visit: Payer: 59 | Attending: Maternal & Fetal Medicine

## 2021-06-20 ENCOUNTER — Ambulatory Visit: Payer: 59 | Admitting: *Deleted

## 2021-06-20 VITALS — BP 108/59 | HR 81

## 2021-06-20 DIAGNOSIS — Z3A22 22 weeks gestation of pregnancy: Secondary | ICD-10-CM | POA: Diagnosis not present

## 2021-06-20 DIAGNOSIS — O321XX0 Maternal care for breech presentation, not applicable or unspecified: Secondary | ICD-10-CM | POA: Diagnosis not present

## 2021-06-20 DIAGNOSIS — Z3402 Encounter for supervision of normal first pregnancy, second trimester: Secondary | ICD-10-CM

## 2021-06-20 DIAGNOSIS — Z362 Encounter for other antenatal screening follow-up: Secondary | ICD-10-CM | POA: Diagnosis present

## 2021-06-20 DIAGNOSIS — Z369 Encounter for antenatal screening, unspecified: Secondary | ICD-10-CM | POA: Diagnosis not present

## 2021-06-29 ENCOUNTER — Other Ambulatory Visit: Payer: Self-pay

## 2021-06-29 ENCOUNTER — Ambulatory Visit (INDEPENDENT_AMBULATORY_CARE_PROVIDER_SITE_OTHER): Payer: 59 | Admitting: Obstetrics and Gynecology

## 2021-06-29 ENCOUNTER — Encounter: Payer: 59 | Admitting: Medical

## 2021-06-29 VITALS — BP 102/68 | HR 82 | Wt 157.8 lb

## 2021-06-29 DIAGNOSIS — R87619 Unspecified abnormal cytological findings in specimens from cervix uteri: Secondary | ICD-10-CM | POA: Insufficient documentation

## 2021-06-29 DIAGNOSIS — R87612 Low grade squamous intraepithelial lesion on cytologic smear of cervix (LGSIL): Secondary | ICD-10-CM

## 2021-06-29 DIAGNOSIS — Z3402 Encounter for supervision of normal first pregnancy, second trimester: Secondary | ICD-10-CM

## 2021-06-29 NOTE — Progress Notes (Signed)
? ?  PRENATAL VISIT NOTE ? ?Subjective:  ?Yolanda Valdez is a 21 y.o. G1P0000 at [redacted]w[redacted]d being seen today for ongoing prenatal care.  She is currently monitored for the following issues for this low-risk pregnancy and has Supervision of normal first pregnancy; Nausea and vomiting in pregnancy; Vaginal yeast infection; and Abnormal Pap smear of cervix on their problem list. ? ?Patient reports  Left toe pain .  Contractions: Not present. Vag. Bleeding: None.  Movement: Present. Denies leaking of fluid.  ? ?The following portions of the patient's history were reviewed and updated as appropriate: allergies, current medications, past family history, past medical history, past social history, past surgical history and problem list.  ? ?Objective:  ? ?Vitals:  ? 06/29/21 1332  ?BP: 102/68  ?Pulse: 82  ?Weight: 157 lb 12.8 oz (71.6 kg)  ? ? ?Fetal Status: Fetal Heart Rate (bpm): 156   Movement: Present    ? ?General:  Alert, oriented and cooperative. Patient is in no acute distress.  ?Skin: Skin is warm and dry. No rash noted.   ?Cardiovascular: Normal heart rate noted  ?Respiratory: Normal respiratory effort, no problems with respiration noted  ?Abdomen: Soft, gravid, appropriate for gestational age.  Pain/Pressure: Absent     ?Pelvic: Cervical exam deferred        ?Extremities: Normal range of motion.  Edema: Trace, Left proximal,  metatarsal tenderness with deep palpation. No surrounding pain, no edema or erythema.   ?Mental Status: Normal mood and affect. Normal behavior. Normal judgment and thought content.  ? ?Assessment and Plan:  ?Pregnancy: G1P0000 at [redacted]w[redacted]d ? ?1.  Encounter for supervision of normal first pregnancy in second trimester ? ?She is doing well ?Having occasional HA that is relieved with 1 gram of tylenol.  ?She reports left proximal, metatarsal toe pain x 1 week. She reports swelling to the area. Pain does not radiate or travel. Pain is worse in that area when she walks or puts her shoes on. There is no  redness or erythema. She is instructed to call her PCP at [facility] for further evaluation.  ?2 hour GTT in 4 weeks, come fasting, nothing to eat or drink after midnight.  ? ? ?Preterm labor symptoms and general obstetric precautions including but not limited to vaginal bleeding, contractions, leaking of fluid and fetal movement were reviewed in detail with the patient. ?Please refer to After Visit Summary for other counseling recommendations.  ? ?No follow-ups on file. ? ?Future Appointments  ?Date Time Provider Department Center  ?07/28/2021  8:30 AM [facility] None  ?07/28/2021  9:35 AM Constant, Gigi Gin, MD [facility] None  ? ? ?Venia Carbon, NP  ?

## 2021-06-29 NOTE — Progress Notes (Signed)
Pt presents for ROB. ?Reports headaches a few times a week. Has some relief with Tylenol.  ?Also reports left foot pain, swelling.  ?

## 2021-06-30 ENCOUNTER — Encounter: Payer: Self-pay | Admitting: Family Medicine

## 2021-06-30 ENCOUNTER — Ambulatory Visit (INDEPENDENT_AMBULATORY_CARE_PROVIDER_SITE_OTHER): Payer: 59 | Admitting: Family Medicine

## 2021-06-30 VITALS — BP 108/68 | HR 111 | Temp 98.0°F | Ht 63.3 in | Wt 158.8 lb

## 2021-06-30 DIAGNOSIS — M79672 Pain in left foot: Secondary | ICD-10-CM

## 2021-06-30 NOTE — Progress Notes (Signed)
? ?  Yolanda Valdez is a 21 y.o. female who presents today for an office visit. ? ?Assessment/Plan:  ?New/Acute Problems: ?Left foot pain ?Consistent with metatarsalgia.  We discussed metatarsal support pad and when was given her in the office today.  She is concerned about fracture.  Do not think this is the case that we will check an x-ray to confirm.  Medications options are limited due to her being pregnant.  She can continue using Tylenol as needed.  She can also use ice.  She will let me know if not improving and we can refer to PT or sports med. ? ?Chronic Problems Addressed Today: ?No problem-specific Assessment & Plan notes found for this encounter. ? ? ?  ?Subjective:  ?HPI: ? ?Patient here with foot pain. Started about a week ago. Located on left foot. Some swelling. She notes swelling has come down with resting. However, pain is still present. She has tried balm and crystal foot spray with no relief. Pain is mostly located on top of the feet. She notes pain seems to be spreading. She is wearing slippers at home most of the time. She is concern about fracture and would like to get x-ray for this issue. She is pregnant and would not like to be started on any medication. No other treatments tried.  No other obvious alleviating or aggravating. No injuries. No numbness or tingling. ? ?   ?  ?Objective:  ?Physical Exam: ?LMP 01/12/2021   ?Gen: No acute distress, resting comfortably ?MSK: ?- Left Foot: No deformities.  Tenderness to palpation along distal end of third through fifth metatarsals.  No palpable click on lateral squeeze of this does reproduce some pain.  Neurovascular intact distally. ?Neuro: Grossly normal, moves all extremities ?Psych: Normal affect and thought content ? ?   ? ?I,Savera Zaman,acting as a Neurosurgeon for Jacquiline Doe, MD.,have documented all relevant documentation on the behalf of Jacquiline Doe, MD,as directed by  Jacquiline Doe, MD while in the presence of Jacquiline Doe, MD.  ? ?I, Jacquiline Doe, MD, have reviewed all documentation for this visit. The documentation on 06/30/21 for the exam, diagnosis, procedures, and orders are all accurate and complete. ? ?Katina Degree. [name], MD ?06/30/2021 8:31 AM  ? ?

## 2021-06-30 NOTE — Patient Instructions (Signed)
It was very nice to see you today! ? ?Please wear the support pad in your shoe.  We will get an x-ray today. ? ?Let me know if not improving over the next couple of weeks. ? ?Take care, ?Dr Jerline Pain ? ?PLEASE NOTE: ? ?If you had any lab tests please let us know if you have not heard back within a few days. You may see your results on mychart before we have a chance to review them but we will give you a call once they are reviewed by [place]. If we ordered any referrals today, please let us know if you have not heard from their office within the next week.  ? ?Please try these tips to maintain a healthy lifestyle: ? ?Eat at least 3 REAL meals and 1-2 snacks per day.  Aim for no more than 5 hours between eating.  If you eat breakfast, please do so within one hour of getting up.  ? ?Each meal should contain half fruits/vegetables, one quarter protein, and one quarter carbs (no bigger than a computer mouse) ? ?Cut down on sweet beverages. This includes juice, soda, and sweet tea.  ? ?Drink at least 1 glass of water with each meal and aim for at least 8 glasses per day ? ?Exercise at least 150 minutes every week.   ?

## 2021-07-22 ENCOUNTER — Encounter: Payer: Self-pay | Admitting: Family Medicine

## 2021-07-22 ENCOUNTER — Ambulatory Visit (INDEPENDENT_AMBULATORY_CARE_PROVIDER_SITE_OTHER): Payer: 59 | Admitting: Family Medicine

## 2021-07-22 VITALS — BP 105/68 | HR 91 | Temp 98.3°F | Ht 63.3 in | Wt 162.6 lb

## 2021-07-22 DIAGNOSIS — R21 Rash and other nonspecific skin eruption: Secondary | ICD-10-CM

## 2021-07-22 MED ORDER — TRIAMCINOLONE ACETONIDE 0.5 % EX OINT: 1.0000 "application " | TOPICAL_OINTMENT | Freq: Two times a day (BID) | CUTANEOUS | 0 refills | Status: DC

## 2021-07-22 NOTE — Progress Notes (Signed)
? ?  Yolanda Valdez is a 21 y.o. female who presents today for an office visit. ? ?Assessment/Plan:  ?Rash ?Consistent with atopic dermatitis/eczema.  Recommended daily emollients.  Given her degree of pruritus we will also start topical triamcinolone.  She can use over-the-counter cetirizine as needed.  She will let me know if not improving.  We discussed reasons to return to care. ? ? ?  ?Subjective:  ?HPI: ? ?Patient with worsening rash.  Started about a week ago.  Initially located on posterior legs.  Has begun to progress and is also noted currently also.  Very itchy.  She has tried several over-the-counter medications including Benadryl without much improvement.  She has been using lotion.  No family history of eczema.  No recent illnesses.  No fevers or chills. ? ?   ?  ?Objective:  ?Physical Exam: ?BP 105/68 (BP Location: Right Arm)   Pulse 91   Temp 98.3 ?F (36.8 ?C) (Temporal)   Ht 5' 3.3" (1.608 m)   Wt 162 lb 9.6 oz (73.8 kg)   LMP 01/12/2021   SpO2 99%   BMI 28.53 kg/m?   ?Gen: No acute distress, resting comfortably ?Skin: Excoriated appearing eczematous rash on posterior legs bilaterally.  No vesicles or pustules. ?Neuro: Grossly normal, moves all extremities ?Psych: Normal affect and thought content ? ?   ? ?Algis Greenhouse. Jerline Pain, MD ?07/22/2021 11:08 AM  ?

## 2021-07-22 NOTE — Patient Instructions (Signed)
It was very nice to see you today! ? ?I think you have an eczema flare.  This can be common during pregnancy.  Please use the triamcinolone twice daily.  Please try using daily cetirizine to help with itching.  Let me know if not improving in the next week or so. ? ?Take care, ?Dr [name] ? ?PLEASE NOTE: ? ?If you had any lab tests please let us know if you have not heard back within a few days. You may see your results on mychart before we have a chance to review them but we will give you a call once they are reviewed by [place]. If we ordered any referrals today, please let us know if you have not heard from their office within the next week.  ? ?Please try these tips to maintain a healthy lifestyle: ? ?Eat at least 3 REAL meals and 1-2 snacks per day.  Aim for no more than 5 hours between eating.  If you eat breakfast, please do so within one hour of getting up.  ? ?Each meal should contain half fruits/vegetables, one quarter protein, and one quarter carbs (no bigger than a computer mouse) ? ?Cut down on sweet beverages. This includes juice, soda, and sweet tea.  ? ?Drink at least 1 glass of water with each meal and aim for at least 8 glasses per day ? ?Exercise at least 150 minutes every week.   ?

## 2021-07-28 ENCOUNTER — Encounter: Payer: Self-pay | Admitting: Obstetrics and Gynecology

## 2021-07-28 ENCOUNTER — Ambulatory Visit (INDEPENDENT_AMBULATORY_CARE_PROVIDER_SITE_OTHER): Payer: 59 | Admitting: Obstetrics and Gynecology

## 2021-07-28 ENCOUNTER — Other Ambulatory Visit: Payer: 59

## 2021-07-28 VITALS — BP 110/71 | HR 96 | Wt 162.7 lb

## 2021-07-28 DIAGNOSIS — Z3403 Encounter for supervision of normal first pregnancy, third trimester: Secondary | ICD-10-CM

## 2021-07-28 DIAGNOSIS — R87612 Low grade squamous intraepithelial lesion on cytologic smear of cervix (LGSIL): Secondary | ICD-10-CM

## 2021-07-28 DIAGNOSIS — Z23 Encounter for immunization: Secondary | ICD-10-CM | POA: Diagnosis not present

## 2021-07-28 NOTE — Patient Instructions (Signed)
AREA PEDIATRIC/FAMILY PRACTICE PHYSICIANS ? ?[place] ([address]) ?[facility] ?Chambliss, MD; Eniola, MD; Hale, MD; Hensel, MD; McDiarmid, MD; McIntyer, MD; Neal, MD; Walden, MD ?[address] [place], [place] [address] ?[phone] ?Mon-Fri 8:30-12:30, 1:30-5:00 ?Providers come to see babies at [facility] ?Accepting Medicaid ?[facility] ?Limited providers who accept newborns: Koirala, MD; Morrow, MD; Wolters, MD ?[address], [place], [place] [address] ?[phone] ?Mon-Fri 8:00-5:30 ?Babies seen by providers at [facility] ?Does NOT accept Medicaid ?Please call early in hospitalization for appointment (limited availability)  ?[facility] ?Mulberry, MD ?[address] [place], [place] [address] ?[phone] ?Mon, Tue, Thur, Fri 8:30-5:00, Wed 10:00-7:00 (closed 1-2pm) ?Babies seen by [facility] providers ?Accepting Medicaid ?[facility] ?Rubin, MD ?[address], [place], [place] [address] ?[phone] ?Mon-Fri 8:30-5:00, Sat 8:30-12:00 ?Provider comes to see babies at [facility] ?Accepting Medicaid ?Must have been referred from current patients or contacted office prior to delivery ?[facility] ([facility]) ?Brown, MD; Chandler, MD; Ettefagh, MD; Grant, MD; Lester, MD; McCormick, MD; McQueen, MD; Prose, MD; Simha, MD; Stanley, MD; Stryffeler, NP; Tebben, NP ?[address], [place], [place] [address] ?[phone] ?Mon, Tue, Thur, Fri 8:30-5:30, Wed 9:30-5:30, Sat 8:30-12:30 ?Babies seen by [facility] providers ?Accepting Medicaid ?Only accepting infants of first-time parents or siblings of current patients ?Hospital discharge coordinator will make follow-up appointment ?Jack Amos ?[address], [place], [place]  [address] ?[phone]   Fax - [phone] ?[facility] ?[address], [place], [place]  [address] ?Phone - [phone]   Fax - [phone] ?Shilpa Gosrani ?[address], [place], [place]  [address] ?[phone] ? ?[place] ([address]) ?[facility] ?Bates, MD; Brassfield, MD; Cooper, Cox, MD; MD; Davis, MD; Dovico, MD; Ettefaugh, MD; Little, MD; Lowe, MD; Keiffer, MD; Melvin, MD; Sumner, MD; Williams, MD ?[address], [place], [place] [address] ?[phone] ?Mon-Fri 8:30-5:00 (extended evenings Mon-Thur as needed), Sat-Sun 10:00-1:00 ?Providers come to see babies at [facility] ?Accepting Medicaid for families of first-time babies and families with all children in the household age 3 and under. Must register with office prior to making appointment (M-F only). ?[facility] ?Henson, NP; Knapp, MD; Lalonde, MD; Tysinger, PA ?[address] [place], [place] [address] ?[phone] ?Mon-Fri 8:00-5:00 ?Babies seen by providers at [facility] ?Does NOT accept Medicaid/Commercial Insurance Only ?[facility] ([facility])  ?Artis, MD; Barnes, MD; Bratton, MD; Coccaro, MD; Lockett Gardner, MD; Kramer, MD; Marshall, MD; Netherton, MD; Poleto, MD; Skinner, MD ?[address] [place], [place] [address] ?[phone] ?Mon-Fri 8:30-5:30, Sat (Oct.-Mar.) 9:00-1:00 ?Babies seen by providers at [facility] ?Accepting Medicaid ? ?[place] ([address]) ?[facility] ?Reid, MD; Warner, MD ?[address], , [place] [address] ?[phone] ?Mon-Fri 8:30-5:00, Sat 8:30-12:00 ?Providers come to see babies at [facility] ?Does NOT accept Medicaid ?[facility] ?Becker, PA; Hagler, MD; Scifres, PA; Sun, MD; Swayne, MD ?[address], ,  [address] ?[phone] ?Mon-Fri 8:00-5:00 ?Babies seen by providers at [facility] ?Does NOT accept Medicaid ?Only accepting babies of parents who  are patients ?Please call early in hospitalization for appointment (limited availability) ? [facility] ?Clark, MD; Frye, MD; Kelleher, MD; Mack, NP; Miller, MD; O'Keller, MD; Patterson, NP; Pudlo, MD; Puzio, MD; Thomas, MD; Tucker, MD; Twiselton, MD ?[address], [place], [place] [address] ?[phone] ?Mon-Fri 8:00-5:00, Sat 9:00-12:00 ?Providers come to see babies at [facility] ?Does NOT accept Medicaid ? ?[place] ([address]) ?[facility] ?Limited providers accepting new patients: Brake, NP; Wharton, PA ?[address], [place], [place] [address] ?[phone] ?Mon-Fri 8:00-5:00 ?Babies seen by providers at [facility] ?Does NOT accept Medicaid ?Only accepting babies of parents who are patients ?Please call early in hospitalization for appointment (limited availability) ?[facility] ?Gay, MD; Quinlan, MD ?[address] [place], [place] [address] ?[phone] (press 1 to schedule appointment) ?Mon-Fri 8:00-5:00 ?Providers come to see babies at [facility] ?Does NOT accept Medicaid ?[facility] ?Mazer, MD ?[address] [place], [place] [address] ?[phone] ?Mon-Fri 8:30-5:00 (lunch 12:30-1:00), extended hours by appointment only Wed 5:00-6:30 ?Babies seen by [facility] providers ?Accepting Medicaid ?[facility] ?Banks, MD; Jordan, MD; Koberlein, MD ?[address], [place], [place] [address] ?[phone] ?Mon-Fri 8:00-5:00 ?Babies seen by [facility] providers ?Does NOT accept Medicaid ?[facility] ?Parker, MD; Hunter, MD; Wallace, DO ?[address] [place], [place] [address] ?[phone] ?Mon-Fri 8:00-5:00 ?Babies seen by [facility] providers ?Does NOT accept Medicaid ?[facility] ?Brandon, PA; Brecken, PA; Christy, NP; Dees, MD; DeClaire, MD; DeWeese, MD; Hansen, NP; Mills, NP; Parrish, NP; Smoot, NP; Summer, MD; Vapne,  MD ?[address] [place], [place] [address] ?[phone] ?Mon-Fri 8:30-5:00, Sat 10:00-1:00 ?Providers come to see babies at [facility] ?Does NOT accept Medicaid ?Free prenatal information session Tuesdays at 4:45pm ?[facility] ?Bouska, MD; Gordon, PA; Jeffery, PA; Weber, PA ?[address] [place] [address] ?[phone] ?Mon-Fri 7:30-5:30 ?Babies seen by [facility] providers ?[facility] ?[address], [place], [place]  [address] ?[phone]   Fax - [phone] ? ?[place] ([address]) ?[facility] ?Reese, MD ?[address] [place], [place] [address] ?[phone] ?Mon-Thur 8:00-6:00 ?Providers come to see babies at [facility] ?Accepting Medicaid ?[facility] ?Anderson, NP; Badger, MD; Beal, PA; Spencer, PA ?[address] [place], [place] [address] ?[phone] ?Mon-Thur 7:30-7:30, Fri 7:30-4:30 ?Babies seen by [facility] providers ?Accepting Medicaid ?[facility] ?Agbuya, MD; Klett, NP; Romgoolam, MD ?[address], [place], [place] [address] ?[phone] ?Mon-Fri 8:30-5:00, Sat 8:30-12:00 ?Providers come to see babies at [facility] ?Accepting Medicaid ?Must have ?Meet & Greet? appointment at office prior to delivery ?[facility] ([facility]) ?McCord, MD; Wallace, MD; Wood, MD ?[address], [place], [place] [address] ?[phone] ?Mon-Wed 8:00-6:00, Thur-Fri 8:00-5:00, Sat 9:00-12:00 ?Providers come to see babies at [facility] ?Does NOT accept Medicaid ?Only accepting siblings of current patients ?[facility]  ?[address], [place], [place]  [address] ?[phone]   Fax - [phone] ?[facility] ?[address], [place], [place]  [address] ?[phone]   Fax -  [phone] ? ?[place] ([address]) ? [facility] ?Cirigliano, DO; Matthews, DO ?[address] ,  [address] ?[phone] ?Mon-Fri 7:00-5:00 ?Babies seen by [facility]

## 2021-07-28 NOTE — Progress Notes (Signed)
Pt in office for ROB visit. Pt c/o red, itchy rashes all over her body for 2 weeks. Pt was prescfribed Rx for Kenalog ointment by PCP but states it is not working.  ?

## 2021-07-28 NOTE — Progress Notes (Signed)
? ?  PRENATAL VISIT NOTE ? ?Subjective:  ?Yolanda Valdez is a 21 y.o. G1P0000 at [redacted]w[redacted]d being seen today for ongoing prenatal care.  She is currently monitored for the following issues for this low-risk pregnancy and has Supervision of normal first pregnancy; Nausea and vomiting in pregnancy; Vaginal yeast infection; and Abnormal Pap smear of cervix on their problem list. ? ?Patient reports itching on arms and legs for 2 weeks.  Contractions: Not present. Vag. Bleeding: None.  Movement: Present. Denies leaking of fluid.  ? ?The following portions of the patient's history were reviewed and updated as appropriate: allergies, current medications, past family history, past medical history, past social history, past surgical history and problem list.  ? ?Objective:  ? ?Vitals:  ? 07/28/21 0839  ?BP: 110/71  ?Pulse: 96  ?Weight: 162 lb 11.2 oz (73.8 kg)  ? ? ?Fetal Status: Fetal Heart Rate (bpm): 147 Fundal Height: 28 cm Movement: Present    ? ?General:  Alert, oriented and cooperative. Patient is in no acute distress.  ?Skin: Skin is warm and dry. No rash noted.   ?Cardiovascular: Normal heart rate noted  ?Respiratory: Normal respiratory effort, no problems with respiration noted  ?Abdomen: Soft, gravid, appropriate for gestational age.  Pain/Pressure: Absent     ?Pelvic: Cervical exam deferred        ?Extremities: Normal range of motion.  Edema: None  ?Mental Status: Normal mood and affect. Normal behavior. Normal judgment and thought content.  ? ?Assessment and Plan:  ?Pregnancy: G1P0000 at [redacted]w[redacted]d ?1. Encounter for supervision of normal first pregnancy in third trimester ?Patient is doing well ?Third trimester labs today with glucola ?Bile acids to rule out cholecystitis of pregnancy ?Encouraged the [place] of hydrocortisone cream and benadryl ?Patient is researching pediatrician ?Patient plans IUD for contraception ?- Glucose Tolerance, 2 Hours w/1 Hour ?- RPR ?- CBC ?- HIV antibody (with reflex) ?- Bile acids, total ? ?2. Low  grade squamous intraepithelial lesion on cytologic smear of cervix (LGSIL) ?Repeat pat smear 03/2022 ? ?Preterm labor symptoms and general obstetric precautions including but not limited to vaginal bleeding, contractions, leaking of fluid and fetal movement were reviewed in detail with the patient. ?Please refer to After Visit Summary for other counseling recommendations.  ? ?Return in about 2 weeks (around 08/11/2021) for in person, ROB, Low risk. ? ?Future Appointments  ?Date Time Provider Department Center  ?07/28/2021  9:35 AM Qianna Clagett, Gigi Gin, MD [facility] None  ? ? ?Catalina Antigua, MD ? ?

## 2021-07-30 LAB — CBC
Hematocrit: 35.6 % (ref 34.0–46.6)
Hemoglobin: 11.5 g/dL (ref 11.1–15.9)
MCH: 28.5 pg (ref 26.6–33.0)
MCHC: 32.3 g/dL (ref 31.5–35.7)
MCV: 88 fL (ref 79–97)
Platelets: 263 10*3/uL (ref 150–450)
RBC: 4.03 x10E6/uL (ref 3.77–5.28)
RDW: 13.2 % (ref 11.7–15.4)
WBC: 9 10*3/uL (ref 3.4–10.8)

## 2021-07-30 LAB — GLUCOSE TOLERANCE, 2 HOURS W/ 1HR
Glucose, 1 hour: 89 mg/dL (ref 70–179)
Glucose, 2 hour: 100 mg/dL (ref 70–152)
Glucose, Fasting: 70 mg/dL (ref 70–91)

## 2021-07-30 LAB — HIV ANTIBODY (ROUTINE TESTING W REFLEX): HIV Screen 4th Generation wRfx: NONREACTIVE

## 2021-07-30 LAB — BILE ACIDS, TOTAL: Bile Acids Total: 1.9 umol/L (ref 0.0–10.0)

## 2021-07-30 LAB — RPR: RPR Ser Ql: NONREACTIVE

## 2021-08-08 MED ORDER — HYDROXYZINE PAMOATE 25 MG PO CAPS: 25.0000 mg | ORAL_CAPSULE | Freq: Three times a day (TID) | ORAL | 0 refills | Status: DC | PRN

## 2021-08-16 ENCOUNTER — Ambulatory Visit (INDEPENDENT_AMBULATORY_CARE_PROVIDER_SITE_OTHER): Payer: 59 | Admitting: Obstetrics and Gynecology

## 2021-08-16 ENCOUNTER — Encounter: Payer: Self-pay | Admitting: Obstetrics and Gynecology

## 2021-08-16 VITALS — BP 103/68 | HR 97 | Wt 165.0 lb

## 2021-08-16 DIAGNOSIS — R87612 Low grade squamous intraepithelial lesion on cytologic smear of cervix (LGSIL): Secondary | ICD-10-CM

## 2021-08-16 DIAGNOSIS — Z3403 Encounter for supervision of normal first pregnancy, third trimester: Secondary | ICD-10-CM

## 2021-08-16 DIAGNOSIS — Z3A30 30 weeks gestation of pregnancy: Secondary | ICD-10-CM

## 2021-08-16 NOTE — Progress Notes (Signed)
ROB 30.[redacted] wks GA ?Reports some "contractions", cramping, occ shooting pain: irregular, not sustained. ?Reports episode of dizziness at rest and BP 80's/40s. See Babyscripts. ?

## 2021-08-16 NOTE — Progress Notes (Signed)
? ?  PRENATAL VISIT NOTE ? ?Subjective:  ?Yolanda Valdez is a 21 y.o. G1P0000 at [redacted]w[redacted]d being seen today for ongoing prenatal care.  She is currently monitored for the following issues for this low-risk pregnancy and has Supervision of normal first pregnancy; Nausea and vomiting in pregnancy; Vaginal yeast infection; and Abnormal Pap smear of cervix on their problem list. ? ?Patient reports no complaints.  Contractions: Irritability. Vag. Bleeding: None.  Movement: Present. Denies leaking of fluid.  ? ?The following portions of the patient's history were reviewed and updated as appropriate: allergies, current medications, past family history, past medical history, past social history, past surgical history and problem list.  ? ?Objective:  ? ?Vitals:  ? 08/16/21 0905  ?BP: 103/68  ?Pulse: 97  ?Weight: 165 lb (74.8 kg)  ? ? ?Fetal Status: Fetal Heart Rate (bpm): 150 Fundal Height: 31 cm Movement: Present    ? ?General:  Alert, oriented and cooperative. Patient is in no acute distress.  ?Skin: Skin is warm and dry. No rash noted.   ?Cardiovascular: Normal heart rate noted  ?Respiratory: Normal respiratory effort, no problems with respiration noted  ?Abdomen: Soft, gravid, appropriate for gestational age.  Pain/Pressure: Absent     ?Pelvic: Cervical exam deferred        ?Extremities: Normal range of motion.  Edema: Trace  ?Mental Status: Normal mood and affect. Normal behavior. Normal judgment and thought content.  ? ?Assessment and Plan:  ?Pregnancy: G1P0000 at [redacted]w[redacted]d ?1. Encounter for supervision of normal first pregnancy in third trimester ?Patient is doing well ?Advised to increase fluid intake ?Pruritis has improved ? ?2. Low grade squamous intraepithelial lesion on cytologic smear of cervix (LGSIL) ?Plant to repeat next year ? ?Preterm labor symptoms and general obstetric precautions including but not limited to vaginal bleeding, contractions, leaking of fluid and fetal movement were reviewed in detail with the  patient. ?Please refer to After Visit Summary for other counseling recommendations.  ? ?Return in about 2 weeks (around 08/30/2021) for in person, ROB, Low risk. ? ?Future Appointments  ?Date Time Provider Department Center  ?08/23/2021 12:15 PM Ramgoolam, Emeline Gins, MD [facility]  ? ? ?Catalina Antigua, MD ? ?

## 2021-08-23 ENCOUNTER — Encounter: Payer: Self-pay | Admitting: Pediatrics

## 2021-08-23 ENCOUNTER — Ambulatory Visit (INDEPENDENT_AMBULATORY_CARE_PROVIDER_SITE_OTHER): Payer: Self-pay | Admitting: Pediatrics

## 2021-08-23 DIAGNOSIS — Z7681 Expectant parent(s) prebirth pediatrician visit: Secondary | ICD-10-CM | POA: Insufficient documentation

## 2021-08-23 NOTE — Progress Notes (Signed)
Prenatal counseling for impending newborn done-- Z76.81  

## 2021-08-30 ENCOUNTER — Ambulatory Visit (INDEPENDENT_AMBULATORY_CARE_PROVIDER_SITE_OTHER): Payer: 59 | Admitting: Obstetrics and Gynecology

## 2021-08-30 ENCOUNTER — Encounter: Payer: Self-pay | Admitting: Obstetrics and Gynecology

## 2021-08-30 VITALS — BP 120/70 | HR 104 | Wt 167.8 lb

## 2021-08-30 DIAGNOSIS — Z3A32 32 weeks gestation of pregnancy: Secondary | ICD-10-CM

## 2021-08-30 DIAGNOSIS — Z3403 Encounter for supervision of normal first pregnancy, third trimester: Secondary | ICD-10-CM

## 2021-08-30 MED ORDER — HYDROXYZINE PAMOATE 25 MG PO CAPS: 25.0000 mg | ORAL_CAPSULE | Freq: Three times a day (TID) | ORAL | 0 refills | Status: DC | PRN

## 2021-08-30 NOTE — Progress Notes (Signed)
? ?  PRENATAL VISIT NOTE ? ?Subjective:  ?Yolanda Valdez is a 21 y.o. G1P0000 at [redacted]w[redacted]d being seen today for ongoing prenatal care.  She is currently monitored for the following issues for this low-risk pregnancy and has Supervision of normal first pregnancy; Nausea and vomiting in pregnancy; Vaginal yeast infection; Abnormal Pap smear of cervix; and Counseling of expectant parents at pediatric pre-birth visit on their problem list. ? ?Patient reports  insomnia .  Contractions: Irritability. Vag. Bleeding: None.  Movement: Present. Denies leaking of fluid.  ? ?The following portions of the patient's history were reviewed and updated as appropriate: allergies, current medications, past family history, past medical history, past social history, past surgical history and problem list.  ? ?Objective:  ? ?Vitals:  ? 08/30/21 1126  ?BP: 120/70  ?Pulse: (!) 104  ?Weight: 167 lb 12.8 oz (76.1 kg)  ? ? ?Fetal Status: Fetal Heart Rate (bpm): 143 Fundal Height: 33 cm Movement: Present    ? ?General:  Alert, oriented and cooperative. Patient is in no acute distress.  ?Skin: Skin is warm and dry. No rash noted.   ?Cardiovascular: Normal heart rate noted  ?Respiratory: Normal respiratory effort, no problems with respiration noted  ?Abdomen: Soft, gravid, appropriate for gestational age.  Pain/Pressure: Present     ?Pelvic: Cervical exam deferred        ?Extremities: Normal range of motion.  Edema: Trace  ?Mental Status: Normal mood and affect. Normal behavior. Normal judgment and thought content.  ? ?Assessment and Plan:  ?Pregnancy: G1P0000 at [redacted]w[redacted]d ? ?1. Encounter for supervision of normal first pregnancy in third trimester ? ?Doing well ?Refill on vistaril for sleep ? ?Preterm labor symptoms and general obstetric precautions including but not limited to vaginal bleeding, contractions, leaking of fluid and fetal movement were reviewed in detail with the patient. ?Please refer to After Visit Summary for other counseling  recommendations.  ? ?No follow-ups on file. ? ?No future appointments. ? ?Venia Carbon, NP  ?

## 2021-08-30 NOTE — Progress Notes (Signed)
Patient presents for a ROB.  ?

## 2021-09-14 ENCOUNTER — Telehealth (INDEPENDENT_AMBULATORY_CARE_PROVIDER_SITE_OTHER): Payer: 59 | Admitting: Student

## 2021-09-14 VITALS — BP 126/70 | HR 90

## 2021-09-14 DIAGNOSIS — Z3A35 35 weeks gestation of pregnancy: Secondary | ICD-10-CM

## 2021-09-14 DIAGNOSIS — Z3403 Encounter for supervision of normal first pregnancy, third trimester: Secondary | ICD-10-CM

## 2021-09-14 NOTE — Progress Notes (Signed)
S/w pt for virtual visit, reports fetal movement and pain in right hip and leg.

## 2021-09-14 NOTE — Progress Notes (Signed)
I connected with Yolanda Valdez 09/14/21 at  4:10 PM EDT by: MyChart video and verified that I am speaking with the correct person using two identifiers.  Patient is located at home and provider is located at [facility].     I discussed the limitations, risks, security and privacy concerns of performing an evaluation and management service by MyChart video and the availability of in person appointments. I also discussed with the patient that there may be a patient responsible charge related to this service. By engaging in this virtual visit, you consent to the provision of healthcare.  Additionally, you authorize for your insurance to be billed for the services provided during this visit.  The patient expressed understanding and agreed to proceed.  The following staff members participated in the virtual visit:  Corlis Hove, [facility]    PRENATAL VISIT NOTE  Subjective:  Yolanda Valdez is a 21 y.o. G1P0000 at [redacted]w[redacted]d  for virtual visit for ongoing prenatal care.  She is currently monitored for the following issues for this low-risk pregnancy and has Supervision of normal first pregnancy; Nausea and vomiting in pregnancy; Vaginal yeast infection; Abnormal Pap smear of cervix; and Counseling of expectant parents at pediatric pre-birth visit on their problem list.  Patient reports no complaints.  Contractions: Not present. Vag. Bleeding: None.  Movement: Present. Denies leaking of fluid.   The following portions of the patient's history were reviewed and updated as appropriate: allergies, current medications, past family history, past medical history, past social history, past surgical history and problem list.   Objective:   Vitals:   09/14/21 1611  BP: 126/70  Pulse: 90   Self-Obtained  Fetal Status:     Movement: Present     Assessment and Plan:  Pregnancy: G1P0000 at [redacted]w[redacted]d 1. Encounter for supervision of normal first pregnancy in third trimester -Doing well today, feeling good baby  movement -Anticipatory guidance provided for next visit - Cultures to be collected at next visit  Preterm labor symptoms and general obstetric precautions including but not limited to vaginal bleeding, contractions, leaking of fluid and fetal movement were reviewed in detail with the patient.  No follow-ups on file.  Future Appointments  Date Time Provider Department Center  09/20/2021 11:15 AM Reva Bores, MD [facility] None  09/26/2021  1:30 PM Constant, Gigi Gin, MD [facility] None  10/03/2021 10:35 AM Constant, Gigi Gin, MD [facility] None  10/11/2021 10:35 AM Leftwich-Kirby, Wilmer Floor, CNM [facility] None  10/18/2021 10:35 AM Constant, Gigi Gin, MD [facility] None    Time spent on virtual visit: 15 minutes  Corlis Hove, NP

## 2021-09-20 ENCOUNTER — Ambulatory Visit (INDEPENDENT_AMBULATORY_CARE_PROVIDER_SITE_OTHER): Payer: 59 | Admitting: Obstetrics

## 2021-09-20 ENCOUNTER — Encounter: Payer: Self-pay | Admitting: Obstetrics

## 2021-09-20 ENCOUNTER — Telehealth: Payer: Self-pay

## 2021-09-20 VITALS — BP 119/76 | HR 94 | Wt 170.0 lb

## 2021-09-20 DIAGNOSIS — O365930 Maternal care for other known or suspected poor fetal growth, third trimester, not applicable or unspecified: Secondary | ICD-10-CM

## 2021-09-20 DIAGNOSIS — Z3403 Encounter for supervision of normal first pregnancy, third trimester: Secondary | ICD-10-CM

## 2021-09-20 DIAGNOSIS — Z3A35 35 weeks gestation of pregnancy: Secondary | ICD-10-CM

## 2021-09-20 NOTE — Telephone Encounter (Signed)
Is requesting Hep B vac.  Please advise nurse visit.

## 2021-09-20 NOTE — Progress Notes (Signed)
Subjective:  Yolanda Valdez is a 21 y.o. G1P0000 at [redacted]w[redacted]d being seen today for ongoing prenatal care.  She is currently monitored for the following issues for this low-risk pregnancy and has Supervision of normal first pregnancy; Nausea and vomiting in pregnancy; Vaginal yeast infection; Abnormal Pap smear of cervix; and Counseling of expectant parents at pediatric pre-birth visit on their problem list.  Patient reports no complaints.  Contractions: Irritability. Vag. Bleeding: None.  Movement: Present. Denies leaking of fluid.   The following portions of the patient's history were reviewed and updated as appropriate: allergies, current medications, past family history, past medical history, past social history, past surgical history and problem list. Problem list updated.  Objective:   Vitals:   09/20/21 1131  BP: 119/76  Pulse: 94  Weight: 170 lb (77.1 kg)    Fetal Status: Fetal Heart Rate (bpm): 155   Movement: Present     General:  Alert, oriented and cooperative. Patient is in no acute distress.  Skin: Skin is warm and dry. No rash noted.   Cardiovascular: Normal heart rate noted  Respiratory: Normal respiratory effort, no problems with respiration noted  Abdomen: Soft, gravid, small for gestational age. Pain/Pressure: Present     Pelvic:  Cervical exam deferred        Extremities: Normal range of motion.  Edema: Trace  Mental Status: Normal mood and affect. Normal behavior. Normal judgment and thought content.   Urinalysis:      Assessment and Plan:  Pregnancy: G1P0000 at [redacted]w[redacted]d  1. Encounter for supervision of normal first pregnancy in third trimester  2. SGA (small for gestational age) Rx: - [place] MFM OB FOLLOW UP; Future   Term labor symptoms and general obstetric precautions including but not limited to vaginal bleeding, contractions, leaking of fluid and fetal movement were reviewed in detail with the patient. Please refer to After Visit Summary for other counseling  recommendations.   Return in about 1 week (around 09/27/2021) for ROB with midwife, ROB.   Brock Bad, MD  09/20/21

## 2021-09-20 NOTE — Progress Notes (Signed)
Patient presents for ROB. Patient has no concerns.  

## 2021-09-20 NOTE — Telephone Encounter (Signed)
Patient was advise to call OGYN for immunization

## 2021-09-26 ENCOUNTER — Encounter: Payer: Self-pay | Admitting: Obstetrics and Gynecology

## 2021-09-26 ENCOUNTER — Ambulatory Visit (INDEPENDENT_AMBULATORY_CARE_PROVIDER_SITE_OTHER): Payer: 59 | Admitting: Obstetrics and Gynecology

## 2021-09-26 ENCOUNTER — Other Ambulatory Visit (HOSPITAL_COMMUNITY)
Admission: RE | Admit: 2021-09-26 | Discharge: 2021-09-26 | Disposition: A | Discharge status: Home / Self Care | Payer: 59 | Source: Ambulatory Visit | Attending: Obstetrics and Gynecology | Admitting: Obstetrics and Gynecology

## 2021-09-26 VITALS — BP 105/71 | HR 116 | Wt 172.0 lb

## 2021-09-26 DIAGNOSIS — Z3403 Encounter for supervision of normal first pregnancy, third trimester: Secondary | ICD-10-CM | POA: Diagnosis present

## 2021-09-26 NOTE — Progress Notes (Signed)
   PRENATAL VISIT NOTE  Subjective:  Yolanda Valdez is a 21 y.o. G1P0000 at [redacted]w[redacted]d being seen today for ongoing prenatal care.  She is currently monitored for the following issues for this low-risk pregnancy and has Supervision of normal first pregnancy; Nausea and vomiting in pregnancy; and Abnormal Pap smear of cervix on their problem list.  Patient reports no complaints.  Contractions: Irritability. Vag. Bleeding: None.  Movement: Present. Denies leaking of fluid.   The following portions of the patient's history were reviewed and updated as appropriate: allergies, current medications, past family history, past medical history, past social history, past surgical history and problem list.   Objective:   Vitals:   09/26/21 1348  BP: 105/71  Pulse: (!) 116  Weight: 172 lb (78 kg)    Fetal Status: Fetal Heart Rate (bpm): 156 Fundal Height: 36 cm Movement: Present     General:  Alert, oriented and cooperative. Patient is in no acute distress.  Skin: Skin is warm and dry. No rash noted.   Cardiovascular: Normal heart rate noted  Respiratory: Normal respiratory effort, no problems with respiration noted  Abdomen: Soft, gravid, appropriate for gestational age.  Pain/Pressure: Present     Pelvic: Cervical exam performed in the presence of a chaperone Dilation: 1 Effacement (%): Thick Station: -3  Extremities: Normal range of motion.  Edema: Trace  Mental Status: Normal mood and affect. Normal behavior. Normal judgment and thought content.   Assessment and Plan:  Pregnancy: G1P0000 at [redacted]w[redacted]d 1. Encounter for supervision of normal first pregnancy in third trimester Patient is doing well without complaints Cultures today Has decided on pediatrian Growth ultrasound on 6/13  Preterm labor symptoms and general obstetric precautions including but not limited to vaginal bleeding, contractions, leaking of fluid and fetal movement were reviewed in detail with the patient. Please refer to After  Visit Summary for other counseling recommendations.   Return in about 1 week (around 10/03/2021) for in person.  Future Appointments  Date Time Provider [place]  10/03/2021 10:35 AM Brenton Joines, Vickii Chafe, MD [facility] None  10/04/2021  3:30 PM [facility] NURSE [facility]  10/04/2021  3:45 PM [facility] US5 [facility]  10/11/2021 10:35 AM Leftwich-Kirby, Kathie Dike, CNM [facility] None  10/18/2021 10:35 AM Tierney Behl, Vickii Chafe, MD [facility] None    Mora Bellman, MD

## 2021-09-26 NOTE — Progress Notes (Signed)
ROB/GBS.  Pt wants her cervix checked.  Reports no complaints today.

## 2021-09-27 LAB — CERVICOVAGINAL ANCILLARY ONLY
Chlamydia: NEGATIVE
Comment: NEGATIVE
Comment: NORMAL
Neisseria Gonorrhea: NEGATIVE

## 2021-09-30 LAB — CULTURE, BETA STREP (GROUP B ONLY): Strep Gp B Culture: NEGATIVE

## 2021-10-03 ENCOUNTER — Encounter: Payer: Self-pay | Admitting: Obstetrics and Gynecology

## 2021-10-03 ENCOUNTER — Ambulatory Visit (INDEPENDENT_AMBULATORY_CARE_PROVIDER_SITE_OTHER): Payer: 59 | Admitting: Obstetrics and Gynecology

## 2021-10-03 VITALS — BP 104/71 | HR 90 | Wt 172.0 lb

## 2021-10-03 DIAGNOSIS — Z3403 Encounter for supervision of normal first pregnancy, third trimester: Secondary | ICD-10-CM

## 2021-10-03 NOTE — Progress Notes (Signed)
   PRENATAL VISIT NOTE  Subjective:  Yolanda Valdez is a 21 y.o. G1P0000 at 108w5d being seen today for ongoing prenatal care.  She is currently monitored for the following issues for this low-risk pregnancy and has Supervision of normal first pregnancy; Nausea and vomiting in pregnancy; and Abnormal Pap smear of cervix on their problem list.  Patient reports no complaints.  Contractions: Irritability. Vag. Bleeding: None.  Movement: Present. Denies leaking of fluid.   The following portions of the patient's history were reviewed and updated as appropriate: allergies, current medications, past family history, past medical history, past social history, past surgical history and problem list.   Objective:   Vitals:   10/03/21 1038  BP: 104/71  Pulse: 90  Weight: 172 lb (78 kg)    Fetal Status: Fetal Heart Rate (bpm): 143   Movement: Present     General:  Alert, oriented and cooperative. Patient is in no acute distress.  Skin: Skin is warm and dry. No rash noted.   Cardiovascular: Normal heart rate noted  Respiratory: Normal respiratory effort, no problems with respiration noted  Abdomen: Soft, gravid, appropriate for gestational age.  Pain/Pressure: Present     Pelvic: Cervical exam deferred        Extremities: Normal range of motion.  Edema: Trace  Mental Status: Normal mood and affect. Normal behavior. Normal judgment and thought content.   Assessment and Plan:  Pregnancy: G1P0000 at [redacted]w[redacted]d 1. Encounter for supervision of normal first pregnancy in third trimester Patient is doing well without complaints Growth ultrasound scheduled tomorrow  Term labor symptoms and general obstetric precautions including but not limited to vaginal bleeding, contractions, leaking of fluid and fetal movement were reviewed in detail with the patient. Please refer to After Visit Summary for other counseling recommendations.   Return in about 1 week (around 10/10/2021) for in person, ROB, Low  risk.  Future Appointments  Date Time Provider Department Center  10/04/2021  3:30 PM [facility] NURSE [facility]  10/04/2021  3:45 PM [facility] US5 [facility]  10/11/2021 10:35 AM [facility], Wilmer Floor, CNM [facility] None  10/18/2021 10:35 AM Kingsley Farace, Gigi Gin, MD [facility] None    Catalina Antigua, MD

## 2021-10-04 ENCOUNTER — Ambulatory Visit (HOSPITAL_BASED_OUTPATIENT_CLINIC_OR_DEPARTMENT_OTHER): Payer: Commercial Managed Care - HMO

## 2021-10-04 ENCOUNTER — Encounter (HOSPITAL_COMMUNITY)
Admission: AD | Disposition: A | Discharge status: Home / Self Care | Payer: Self-pay | Source: Home / Self Care | Attending: Obstetrics & Gynecology

## 2021-10-04 ENCOUNTER — Inpatient Hospital Stay (HOSPITAL_COMMUNITY): Payer: Commercial Managed Care - HMO | Admitting: Anesthesiology

## 2021-10-04 ENCOUNTER — Ambulatory Visit: Payer: Commercial Managed Care - HMO | Admitting: *Deleted

## 2021-10-04 ENCOUNTER — Inpatient Hospital Stay (HOSPITAL_COMMUNITY)
Admission: AD | Admit: 2021-10-04 | Discharge: 2021-10-06 | DRG: 786 | Disposition: A | Discharge status: Home / Self Care | Payer: Commercial Managed Care - HMO | Attending: Obstetrics & Gynecology | Admitting: Obstetrics & Gynecology

## 2021-10-04 ENCOUNTER — Encounter (HOSPITAL_COMMUNITY): Payer: Self-pay | Admitting: Obstetrics & Gynecology

## 2021-10-04 ENCOUNTER — Other Ambulatory Visit: Payer: Self-pay

## 2021-10-04 ENCOUNTER — Ambulatory Visit (HOSPITAL_BASED_OUTPATIENT_CLINIC_OR_DEPARTMENT_OTHER): Payer: Commercial Managed Care - HMO | Admitting: Maternal & Fetal Medicine

## 2021-10-04 VITALS — BP 121/75 | HR 108

## 2021-10-04 DIAGNOSIS — O365990 Maternal care for other known or suspected poor fetal growth, unspecified trimester, not applicable or unspecified: Secondary | ICD-10-CM | POA: Diagnosis present

## 2021-10-04 DIAGNOSIS — O42919 Preterm premature rupture of membranes, unspecified as to length of time between rupture and onset of labor, unspecified trimester: Secondary | ICD-10-CM | POA: Insufficient documentation

## 2021-10-04 DIAGNOSIS — Z3400 Encounter for supervision of normal first pregnancy, unspecified trimester: Secondary | ICD-10-CM

## 2021-10-04 DIAGNOSIS — O4593 Premature separation of placenta, unspecified, third trimester: Secondary | ICD-10-CM | POA: Diagnosis present

## 2021-10-04 DIAGNOSIS — Z3A37 37 weeks gestation of pregnancy: Secondary | ICD-10-CM

## 2021-10-04 DIAGNOSIS — O26843 Uterine size-date discrepancy, third trimester: Secondary | ICD-10-CM

## 2021-10-04 DIAGNOSIS — O4100X0 Oligohydramnios, unspecified trimester, not applicable or unspecified: Secondary | ICD-10-CM | POA: Diagnosis present

## 2021-10-04 DIAGNOSIS — O365930 Maternal care for other known or suspected poor fetal growth, third trimester, not applicable or unspecified: Secondary | ICD-10-CM | POA: Diagnosis present

## 2021-10-04 DIAGNOSIS — Z364 Encounter for antenatal screening for fetal growth retardation: Secondary | ICD-10-CM

## 2021-10-04 DIAGNOSIS — Z3403 Encounter for supervision of normal first pregnancy, third trimester: Secondary | ICD-10-CM

## 2021-10-04 DIAGNOSIS — O321XX1 Maternal care for breech presentation, fetus 1: Secondary | ICD-10-CM | POA: Diagnosis not present

## 2021-10-04 DIAGNOSIS — O4103X0 Oligohydramnios, third trimester, not applicable or unspecified: Secondary | ICD-10-CM | POA: Diagnosis present

## 2021-10-04 DIAGNOSIS — D62 Acute posthemorrhagic anemia: Secondary | ICD-10-CM | POA: Diagnosis not present

## 2021-10-04 DIAGNOSIS — R87619 Unspecified abnormal cytological findings in specimens from cervix uteri: Secondary | ICD-10-CM | POA: Diagnosis present

## 2021-10-04 DIAGNOSIS — O9081 Anemia of the puerperium: Secondary | ICD-10-CM | POA: Diagnosis not present

## 2021-10-04 DIAGNOSIS — O4292 Full-term premature rupture of membranes, unspecified as to length of time between rupture and onset of labor: Secondary | ICD-10-CM | POA: Diagnosis present

## 2021-10-04 DIAGNOSIS — O321XX0 Maternal care for breech presentation, not applicable or unspecified: Secondary | ICD-10-CM | POA: Diagnosis present

## 2021-10-04 DIAGNOSIS — O4103X1 Oligohydramnios, third trimester, fetus 1: Secondary | ICD-10-CM | POA: Diagnosis not present

## 2021-10-04 HISTORY — DX: Other specified health status: Z78.9

## 2021-10-04 LAB — CBC
HCT: 34.9 % — ABNORMAL LOW (ref 36.0–46.0)
Hemoglobin: 11 g/dL — ABNORMAL LOW (ref 12.0–15.0)
MCH: 24.4 pg — ABNORMAL LOW (ref 26.0–34.0)
MCHC: 31.5 g/dL (ref 30.0–36.0)
MCV: 77.6 fL — ABNORMAL LOW (ref 80.0–100.0)
Platelets: 309 10*3/uL (ref 150–400)
RBC: 4.5 MIL/uL (ref 3.87–5.11)
RDW: 14.6 % (ref 11.5–15.5)
WBC: 12.2 10*3/uL — ABNORMAL HIGH (ref 4.0–10.5)
nRBC: 0 % (ref 0.0–0.2)

## 2021-10-04 LAB — TYPE AND SCREEN
ABO/RH(D): B POS
Antibody Screen: NEGATIVE

## 2021-10-04 SURGERY — Surgical Case
Anesthesia: Spinal

## 2021-10-04 MED ORDER — KETOROLAC TROMETHAMINE 30 MG/ML IJ SOLN
30.0000 mg | Freq: Once | INTRAMUSCULAR | Status: AC
  Administered 2021-10-04: 30 mg via INTRAVENOUS

## 2021-10-04 MED ORDER — ONDANSETRON HCL 4 MG/2ML IJ SOLN
INTRAMUSCULAR | Status: DC | PRN
  Administered 2021-10-04: 4 mg via INTRAVENOUS

## 2021-10-04 MED ORDER — KETOROLAC TROMETHAMINE 30 MG/ML IJ SOLN
INTRAMUSCULAR | Status: AC
  Filled 2021-10-04: qty 1

## 2021-10-04 MED ORDER — ACETAMINOPHEN 10 MG/ML IV SOLN
INTRAVENOUS | Status: DC | PRN
  Administered 2021-10-04: 1000 mg via INTRAVENOUS

## 2021-10-04 MED ORDER — SODIUM CHLORIDE 0.9 % IV SOLN
500.0000 mg | INTRAVENOUS | Status: AC
  Administered 2021-10-04: 500 mg via INTRAVENOUS
  Filled 2021-10-04: qty 5

## 2021-10-04 MED ORDER — METOCLOPRAMIDE HCL 5 MG/ML IJ SOLN
INTRAMUSCULAR | Status: AC
  Filled 2021-10-04: qty 2

## 2021-10-04 MED ORDER — ONDANSETRON HCL 4 MG/2ML IJ SOLN
4.0000 mg | Freq: Three times a day (TID) | INTRAMUSCULAR | Status: DC | PRN
  Administered 2021-10-05: 4 mg via INTRAVENOUS
  Filled 2021-10-04: qty 2

## 2021-10-04 MED ORDER — ACETAMINOPHEN 500 MG PO TABS: 1000.0000 mg | ORAL_TABLET | Freq: Four times a day (QID) | ORAL | Status: DC

## 2021-10-04 MED ORDER — DEXMEDETOMIDINE HCL IN NACL 80 MCG/20ML IV SOLN
INTRAVENOUS | Status: AC
  Filled 2021-10-04: qty 20

## 2021-10-04 MED ORDER — OXYTOCIN-SODIUM CHLORIDE 30-0.9 UT/500ML-% IV SOLN
INTRAVENOUS | Status: DC | PRN
  Administered 2021-10-04: 30 [IU] via INTRAVENOUS

## 2021-10-04 MED ORDER — SOD CITRATE-CITRIC ACID 500-334 MG/5ML PO SOLN: 30.0000 mL | Freq: Once | ORAL | Status: DC

## 2021-10-04 MED ORDER — SODIUM CHLORIDE 0.9 % IV SOLN: INTRAVENOUS | Status: DC | PRN

## 2021-10-04 MED ORDER — DIPHENHYDRAMINE HCL 50 MG/ML IJ SOLN: 12.5000 mg | INTRAMUSCULAR | Status: DC | PRN

## 2021-10-04 MED ORDER — SODIUM CHLORIDE 0.9 % IR SOLN
Status: DC | PRN
  Administered 2021-10-04: 1

## 2021-10-04 MED ORDER — NALOXONE HCL 4 MG/10ML IJ SOLN: 1.0000 ug/kg/h | INTRAVENOUS | Status: DC | PRN

## 2021-10-04 MED ORDER — BUPIVACAINE IN DEXTROSE 0.75-8.25 % IT SOLN
INTRATHECAL | Status: DC | PRN
  Administered 2021-10-04: 1.6 mL via INTRATHECAL

## 2021-10-04 MED ORDER — ACETAMINOPHEN 500 MG PO TABS: 1000.0000 mg | ORAL_TABLET | Freq: Once | ORAL | Status: DC

## 2021-10-04 MED ORDER — DROPERIDOL 2.5 MG/ML IJ SOLN: 0.6250 mg | Freq: Once | INTRAMUSCULAR | Status: DC | PRN

## 2021-10-04 MED ORDER — SODIUM CHLORIDE 0.9% FLUSH
3.0000 mL | INTRAVENOUS | Status: DC | PRN
Start: 2021-10-04 — End: 2021-10-06

## 2021-10-04 MED ORDER — LEVONORGESTREL 20 MCG/DAY IU IUD: 1.0000 | INTRAUTERINE_SYSTEM | Freq: Once | INTRAUTERINE | Status: DC

## 2021-10-04 MED ORDER — MORPHINE SULFATE (PF) 0.5 MG/ML IJ SOLN
INTRAMUSCULAR | Status: DC | PRN
  Administered 2021-10-04: 150 ug via EPIDURAL

## 2021-10-04 MED ORDER — CEFAZOLIN SODIUM-DEXTROSE 2-4 GM/100ML-% IV SOLN
2.0000 g | INTRAVENOUS | Status: AC
  Administered 2021-10-04: 2 g via INTRAVENOUS

## 2021-10-04 MED ORDER — FENTANYL CITRATE (PF) 100 MCG/2ML IJ SOLN
INTRAMUSCULAR | Status: AC
  Filled 2021-10-04: qty 2

## 2021-10-04 MED ORDER — ACETAMINOPHEN 160 MG/5ML PO SOLN: 1000.0000 mg | Freq: Once | ORAL | Status: DC

## 2021-10-04 MED ORDER — LACTATED RINGERS IV BOLUS
1000.0000 mL | Freq: Once | INTRAVENOUS | Status: AC
  Administered 2021-10-04: 1000 mL via INTRAVENOUS

## 2021-10-04 MED ORDER — DEXAMETHASONE SODIUM PHOSPHATE 4 MG/ML IJ SOLN
INTRAMUSCULAR | Status: AC
  Filled 2021-10-04: qty 1

## 2021-10-04 MED ORDER — FENTANYL CITRATE (PF) 100 MCG/2ML IJ SOLN
INTRAMUSCULAR | Status: DC | PRN
  Administered 2021-10-04: 15 ug via INTRAVENOUS

## 2021-10-04 MED ORDER — ONDANSETRON HCL 4 MG/2ML IJ SOLN
INTRAMUSCULAR | Status: AC
  Filled 2021-10-04: qty 2

## 2021-10-04 MED ORDER — OXYTOCIN-SODIUM CHLORIDE 30-0.9 UT/500ML-% IV SOLN
INTRAVENOUS | Status: AC
  Filled 2021-10-04: qty 500

## 2021-10-04 MED ORDER — DEXMEDETOMIDINE (PRECEDEX) IN NS 20 MCG/5ML (4 MCG/ML) IV SYRINGE
PREFILLED_SYRINGE | INTRAVENOUS | Status: DC | PRN
  Administered 2021-10-04: 4 ug via INTRAVENOUS
  Administered 2021-10-04: 8 ug via INTRAVENOUS
  Administered 2021-10-04: 4 ug via INTRAVENOUS

## 2021-10-04 MED ORDER — MORPHINE SULFATE (PF) 0.5 MG/ML IJ SOLN
INTRAMUSCULAR | Status: AC
  Filled 2021-10-04: qty 10

## 2021-10-04 MED ORDER — BUPIVACAINE HCL (PF) 0.5 % IJ SOLN
INTRAMUSCULAR | Status: AC
  Filled 2021-10-04: qty 30

## 2021-10-04 MED ORDER — PHENYLEPHRINE HCL-NACL 20-0.9 MG/250ML-% IV SOLN
INTRAVENOUS | Status: DC | PRN
  Administered 2021-10-04: 60 ug/min via INTRAVENOUS

## 2021-10-04 MED ORDER — DEXAMETHASONE SODIUM PHOSPHATE 4 MG/ML IJ SOLN
INTRAMUSCULAR | Status: DC | PRN
  Administered 2021-10-04: 4 mg via INTRAVENOUS

## 2021-10-04 MED ORDER — FENTANYL CITRATE (PF) 100 MCG/2ML IJ SOLN: 25.0000 ug | INTRAMUSCULAR | Status: DC | PRN

## 2021-10-04 MED ORDER — NALOXONE HCL 0.4 MG/ML IJ SOLN: 0.4000 mg | INTRAMUSCULAR | Status: DC | PRN

## 2021-10-04 MED ORDER — DIPHENHYDRAMINE HCL 25 MG PO CAPS: 25.0000 mg | ORAL_CAPSULE | ORAL | Status: DC | PRN

## 2021-10-04 MED ORDER — CEFAZOLIN SODIUM-DEXTROSE 2-4 GM/100ML-% IV SOLN
INTRAVENOUS | Status: AC
  Filled 2021-10-04: qty 100

## 2021-10-04 MED ORDER — STERILE WATER FOR IRRIGATION IR SOLN
Status: DC | PRN
  Administered 2021-10-04: 1000 mL

## 2021-10-04 MED ORDER — LACTATED RINGERS IV SOLN: INTRAVENOUS | Status: DC

## 2021-10-04 MED ORDER — METOCLOPRAMIDE HCL 5 MG/ML IJ SOLN
INTRAMUSCULAR | Status: DC | PRN
  Administered 2021-10-04: 10 mg via INTRAVENOUS

## 2021-10-04 SURGICAL SUPPLY — 33 items
BARRIER ADHS 3X4 INTERCEED (GAUZE/BANDAGES/DRESSINGS) IMPLANT
BENZOIN TINCTURE PRP APPL 2/3 (GAUZE/BANDAGES/DRESSINGS) IMPLANT
CHLORAPREP W/TINT 26ML (MISCELLANEOUS) ×4 IMPLANT
CLAMP CORD UMBIL (MISCELLANEOUS) ×2 IMPLANT
CLOTH BEACON ORANGE TIMEOUT ST (SAFETY) ×2 IMPLANT
DRSG OPSITE POSTOP 4X10 (GAUZE/BANDAGES/DRESSINGS) ×2 IMPLANT
ELECT REM PT RETURN 9FT ADLT (ELECTROSURGICAL) ×2
ELECTRODE REM PT RTRN 9FT ADLT (ELECTROSURGICAL) ×1 IMPLANT
EXTRACTOR VACUUM KIWI (MISCELLANEOUS) IMPLANT
GLOVE BIO SURGEON STRL SZ 6.5 (GLOVE) ×2 IMPLANT
GLOVE BIOGEL PI IND STRL 7.0 (GLOVE) ×2 IMPLANT
GLOVE BIOGEL PI INDICATOR 7.0 (GLOVE) ×2
GOWN STRL REUS W/TWL LRG LVL3 (GOWN DISPOSABLE) ×4 IMPLANT
KIT ABG SYR 3ML LUER SLIP (SYRINGE) IMPLANT
NDL HYPO 25X5/8 SAFETYGLIDE (NEEDLE) IMPLANT
NEEDLE HYPO 22GX1.5 SAFETY (NEEDLE) IMPLANT
NEEDLE HYPO 25X5/8 SAFETYGLIDE (NEEDLE) IMPLANT
NS IRRIG 1000ML POUR BTL (IV SOLUTION) ×2 IMPLANT
PACK C SECTION WH (CUSTOM PROCEDURE TRAY) ×2 IMPLANT
PAD OB MATERNITY 4.3X12.25 (PERSONAL CARE ITEMS) ×2 IMPLANT
RETRACTOR WND ALEXIS 25 LRG (MISCELLANEOUS) IMPLANT
RTRCTR WOUND ALEXIS 25CM LRG (MISCELLANEOUS) ×2
STRIP CLOSURE SKIN 1/2X4 (GAUZE/BANDAGES/DRESSINGS) IMPLANT
SUT PLAIN 2 0 (SUTURE) ×2
SUT PLAIN ABS 2-0 CT1 27XMFL (SUTURE) IMPLANT
SUT VIC AB 0 CT1 36 (SUTURE) ×10 IMPLANT
SUT VIC AB 2-0 CT1 27 (SUTURE) ×2
SUT VIC AB 2-0 CT1 TAPERPNT 27 (SUTURE) ×1 IMPLANT
SUT VIC AB 4-0 PS2 27 (SUTURE) ×2 IMPLANT
SYR CONTROL 10ML LL (SYRINGE) IMPLANT
TOWEL OR 17X24 6PK STRL BLUE (TOWEL DISPOSABLE) ×2 IMPLANT
TRAY FOLEY W/BAG SLVR 14FR LF (SET/KITS/TRAYS/PACK) IMPLANT
WATER STERILE IRR 1000ML POUR (IV SOLUTION) ×2 IMPLANT

## 2021-10-04 NOTE — MAU Note (Signed)
.  Yolanda Valdez is a 21 y.o. at [redacted]w[redacted]d here in MAU reporting: sent from MFM for low fluid.denies pian or cramping , no bleeding or leaking todaty. Stated she thought she might have a a trickle of fluid come out about 2 days ago but not a lot and only that day. Reports fetal movement has been less for several weeks. Has told OB doctor.   Onset of complaint: today Pain score: 0  Vitals:   10/04/21 1747  BP: 122/78  Pulse: (!) 112  Resp: 18  Temp: 98 F (36.7 C)     FHT:145 Lab orders placed from triage:

## 2021-10-04 NOTE — Anesthesia Postprocedure Evaluation (Signed)
Anesthesia Post Note  Patient: Arnisha Pianka  Procedure(s) Performed: CESAREAN SECTION     Patient location during evaluation: PACU Anesthesia Type: Spinal Level of consciousness: awake and alert Pain management: pain level controlled Vital Signs Assessment: post-procedure vital signs reviewed and stable Respiratory status: spontaneous breathing, nonlabored ventilation and respiratory function stable Cardiovascular status: blood pressure returned to baseline Postop Assessment: no apparent nausea or vomiting, spinal receding, no headache and no backache Anesthetic complications: no   No notable events documented.  Last Vitals:  Vitals:   10/04/21 2247 10/04/21 2300  BP: 96/60 (!) 105/59  Pulse: 67 70  Resp: 15 17  Temp:    SpO2: 100% 100%    Last Pain:  Vitals:   10/04/21 2300  PainSc: 0-No pain   Pain Goal:    LLE Motor Response: Purposeful movement (10/04/21 2300) LLE Sensation: Numbness, Tingling (10/04/21 2300) RLE Motor Response: Purposeful movement (10/04/21 2300) RLE Sensation: Numbness, Tingling (10/04/21 2300)     Epidural/Spinal Function Cutaneous sensation: Vague (10/04/21 2300), Patient able to flex knees: No (10/04/21 2300), Patient able to lift hips off bed: No (10/04/21 2300), Back pain beyond tenderness at insertion site: No (10/04/21 2300), Progressively worsening motor and/or sensory loss: No (10/04/21 2300), Bowel and/or bladder incontinence post epidural: No (10/04/21 2300)  Shanda Howells

## 2021-10-04 NOTE — Anesthesia Procedure Notes (Signed)
Spinal  Patient location during procedure: OR Start time: 10/04/2021 8:15 PM End time: 10/04/2021 8:19 PM Reason for block: surgical anesthesia Staffing Performed: anesthesiologist  Anesthesiologist: Brennan Bailey, MD Performed by: Brennan Bailey, MD Authorized by: Brennan Bailey, MD   Preanesthetic Checklist Completed: patient identified, IV checked, risks and benefits discussed, monitors and equipment checked, pre-op evaluation and timeout performed Spinal Block Patient position: sitting Prep: DuraPrep and site prepped and draped Patient monitoring: heart rate, continuous pulse ox and blood pressure Approach: midline Location: L3-4 Injection technique: single-shot Needle Needle type: Pencan  Needle gauge: 24 G Needle length: 10 cm Assessment Sensory level: T4 Events: CSF return Additional Notes Risks, benefits, and alternative discussed. Patient gave consent to procedure. Prepped and draped in sitting position. Patient extremely anxious and unable to remain still for procedure. Clear CSF obtained after 2 needle redirections, however unable to aspirate and patient continues to move, therefore medication injected despite lack of aspiration. No pain or paraesthesias with injection. Patient with immediate warmth and tingling in legs as expected. Appropriate level to pinprick and cold obtained prior to start of surgery. Vital signs stable. Tawny Asal, MD

## 2021-10-04 NOTE — H&P (Signed)
LABOR AND DELIVERY ADMISSION HISTORY AND PHYSICAL NOTE  Yolanda Valdez is a 21 y.o. female G1P0000 with IUP at [redacted]w[redacted]d by LMP presenting from MFM for delivery. She was seen by Dr. Grace Bushy today with now newly diagnosed FGR (EFW 10% with AC 2.8%) and oligohydramnios (total AFI 2.1). Additionally, fetus is in breech presentation. She reports for the past few days she has had an occasional "drop or two" on her underwear and feels it occurs often.   She reports positive fetal movement. She denies vaginal bleeding.  Prenatal History/Complications: PNC at [facility]  Pregnancy complications:  - Newly diagnosed FGR  - LGSIL on pap   Past Medical History: Past Medical History:  Diagnosis Date   Medical history non-contributory     Past Surgical History: Past Surgical History:  Procedure Laterality Date   NO PAST SURGERIES      Obstetrical History: OB History     Gravida  1   Para  0   Term  0   Preterm  0   AB  0   Living         SAB  0   IAB  0   Ectopic  0   Multiple      Live Births              Social History: Social History   Socioeconomic History   Marital status: Married    Spouse name: Not on file   Number of children: Not on file   Years of education: Not on file   Highest education level: Not on file  Occupational History   Not on file  Tobacco Use   Smoking status: Never   Smokeless tobacco: Never  Vaping Use   Vaping Use: Never used  Substance and Sexual Activity   Alcohol use: Never   Drug use: Never   Sexual activity: Yes    Partners: Male    Birth control/protection: None  Other Topics Concern   Not on file  Social History Narrative   ** Merged History Encounter **       Social Determinants of Health   Financial Resource Strain: Not on file  Food Insecurity: Not on file  Transportation Needs: Not on file  Physical Activity: Not on file  Stress: Not on file  Social Connections: Not on file    Family History: Family History   Problem Relation Age of Onset   Hypertension Mother    Diabetes type II Mother    Diabetes Mother    Hypertension Paternal Grandfather    Diabetes Paternal Grandfather     Allergies: No Known Allergies  No medications prior to admission.     Review of Systems  All systems reviewed and negative except as stated in HPI  Physical Exam Blood pressure 122/78, pulse (!) 112, temperature 98 F (36.7 C), resp. rate 18, height 5\' 4"  (1.626 m), weight 78 kg, last menstrual period 01/12/2021. General appearance: alert, oriented, NAD Lungs: normal respiratory effort Heart: regular rate Abdomen: soft, non-tender; gravid, FH appropriate for GA Extremities: No calf swelling or tenderness Presentation: Breech confirmed by BSUS with fetal head in RUQ Fetal monitoring: 150/mod/15x15/none Uterine activity: every 5 or so minutes (not feeling them)    Prenatal labs: ABO, Rh: B/Positive/-- (12/07 1510) Antibody: Negative (12/07 1510) Rubella: 13.40 (12/07 1510) RPR: Non Reactive (04/06 1041)  HBsAg: Negative (12/07 1510)  HIV: Non Reactive (04/06 1041)  GC/Chlamydia: negative  GBS: Negative/-- (06/05 1422)  2-hr GTT: passed Genetic screening:  LR  Anatomy US: normal   Prenatal Transfer Tool  Maternal Diabetes: No Genetic Screening: Normal Maternal Ultrasounds/Referrals: IUGR Fetal Ultrasounds or other Referrals:  None Maternal Substance Abuse:  No Significant Maternal Medications:  None Significant Maternal Lab Results: Group B Strep negative  No results found for this or any previous visit (from the past 24 hour(s)).  Patient Active Problem List   Diagnosis Date Noted   Abnormal Pap smear of cervix 06/29/2021   Nausea and vomiting in pregnancy 04/27/2021   Supervision of normal first pregnancy 03/15/2021    Assessment: Yolanda Valdez is a 21 y.o. G1P0000 at [redacted]w[redacted]d here for delivery due to newly diagnosed FGR with oligohydramnios (?PROM) and breech presentation. Discussed  implications with breech presentation. Recommended primary C/S as unfavorable candidate for vaginal breech attempt and suspected fetal intolerance for ECV. She would like to proceed with C/S.   The risks of cesarean section discussed with the patient included but were not limited to: bleeding which may require transfusion or reoperation; infection which may require antibiotics; injury to bowel, bladder, ureters or other surrounding organs; injury to the fetus; need for additional procedures including hysterectomy in the event of a life-threatening hemorrhage; placental abnormalities wth subsequent pregnancies, incisional problems, thromboembolic phenomenon and other postoperative/anesthesia complications. The patient concurred with the proposed plan, giving informed written consent for the procedure. Patient has been NPO since 11:30am she will remain NPO for procedure. Anesthesia and OR aware. Preoperative prophylactic antibiotics and SCDs ordered on call to the OR. To OR when ready.    #FWB: Cat I  #MOF: Breast #MOC: Mirena IUD with C/S  #Circ: NA, female   Yolanda Valdez 10/04/2021, 6:55 PM

## 2021-10-04 NOTE — Transfer of Care (Signed)
Immediate Anesthesia Transfer of Care Note  Patient: Yolanda Valdez  Procedure(s) Performed: CESAREAN SECTION  Patient Location: PACU  Anesthesia Type:Spinal  Level of Consciousness: awake, alert  and oriented  Airway & Oxygen Therapy: Patient Spontanous Breathing  Post-op Assessment: Report given to RN and Post -op Vital signs reviewed and stable  Post vital signs: Reviewed and stable  Last Vitals:  Vitals Value Taken Time  BP 89/46 10/04/21 2140  Temp    Pulse 84 10/04/21 2142  Resp 18 10/04/21 2142  SpO2 100 % 10/04/21 2142  Vitals shown include unvalidated device data.  Last Pain: There were no vitals filed for this visit.       Complications: No notable events documented.

## 2021-10-04 NOTE — Discharge Summary (Signed)
Postpartum Discharge Summary     Patient Name: Yolanda Valdez DOB: 05/19/2000 MRN: [id number]  Date of admission: 10/04/2021 Delivery date:10/04/2021  Delivering provider: Woodroe Mode  Date of discharge: 10/06/2021  Admitting diagnosis: Delivery of pregnancy by cesarean section [O82] Intrauterine pregnancy: [redacted]w[redacted]d     Secondary diagnosis:  Principal Problem:   Delivery of pregnancy by cesarean section Active Problems:   Supervision of normal first pregnancy   Abnormal Pap smear of cervix   Cesarean delivery delivered   Pregnancy affected by fetal growth restriction   Oligohydramnios   Acute blood loss anemia  Additional problems: None    Discharge diagnosis: Term Pregnancy Delivered and FGR and oligohydramnios                                               Post partum procedures: None Augmentation: N/A Complications: Placental Abruption  Hospital course: Sceduled C/S - 21 y.o. yo G1P1001 at [redacted]w[redacted]d was admitted to the hospital 10/04/2021 for unscheduled cesarean section with the following indication: Patient was at [place] office for growth [place] for S<D. Was newly diagnosed with FGR (10%ile with AC < 2.8%ile) as well as oligohydramnios and fetus was in breech presentation. Due to FGR and oligohydramnios delivery was recommended and given breech presentation in primip with low fluid cesarean delivery was recommended.  Delivery details are as follows:  Membrane Rupture Time/Date: 8:46 PM ,10/04/2021   Delivery Method:C-Section, Low Transverse  Details of operation can be found in separate operative note.  Patient had an uncomplicated postpartum course.  Her hemoglobin on POD#1 was 9, for which she received IV Venofer.  She is ambulating, tolerating a regular diet, passing flatus, and urinating well.  Her pain and bleeding are controlled.  She is breast and formula feeding well.  Patient is discharged home in stable condition on  10/06/21        Newborn Data: Birth date:10/04/2021  Birth  time:8:46 PM  Gender:Female  Living status:Living  Apgars:8 ,9  Weight:2680 g     Magnesium Sulfate received: No BMZ received: No Rhophylac: N/A MMR: N/A T-DaP: Given prenatally Flu: No Transfusion: No  Physical exam  Vitals:   10/05/21 1030 10/05/21 1330 10/05/21 2127 10/06/21 0530  BP: 108/64 101/72 100/74 102/67  Pulse: 77 84 68 77  Resp: $Remo'18 16 18 18  'yzgBC$ Temp: 98.6 F (37 C) 98 F (36.7 C) (!) 97.5 F (36.4 C) 98.3 F (36.8 C)  TempSrc: Oral Oral Oral Oral  SpO2: 100% 100% 100% 98%  Weight:      Height:       General: alert, cooperative, and no distress Lochia: appropriate Uterine Fundus: firm and below umbilicus  Incision: healing well with no significant drainage, no significant erythema, dressing is clean, dry, and intact DVT Evaluation: no LE edema or calf tenderness to palpation   Labs: Lab Results  Component Value Date   WBC 15.0 (H) 10/05/2021   HGB 9.4 (L) 10/05/2021   HCT 30.0 (L) 10/05/2021   MCV 76.5 (L) 10/05/2021   PLT 253 10/05/2021       No data to display         Edinburgh Score:    10/04/2021   11:40 PM  Edinburgh Postnatal Depression Scale Screening Tool  I have been able to laugh and see the funny side of things. 0  I have  looked forward with enjoyment to things. 0  I have blamed myself unnecessarily when things went wrong. 0  I have been anxious or worried for no good reason. 1  I have felt scared or panicky for no good reason. 1  Things have been getting on top of me. 1  I have been so unhappy that I have had difficulty sleeping. 0  I have felt sad or miserable. 0  I have been so unhappy that I have been crying. 0  The thought of harming myself has occurred to me. 0  Edinburgh Postnatal Depression Scale Total 3    After visit meds:  Allergies as of 10/06/2021       Reactions   Beef-derived Products    Chicken Protein    Fish-derived Products    Pork-derived Products         Medication List     TAKE these  medications    acetaminophen 500 MG tablet Commonly known as: TYLENOL Take 2 tablets (1,000 mg total) by mouth every 8 (eight) hours as needed (pain).   ferrous sulfate 325 (65 FE) MG tablet Take 1 tablet (325 mg total) by mouth every other day. Start taking on: October 07, 2021   ibuprofen 600 MG tablet Commonly known as: ADVIL Take 1 tablet (600 mg total) by mouth every 6 (six) hours as needed (pain).   oxyCODONE 5 MG immediate release tablet Commonly known as: Oxy IR/ROXICODONE Take 1 tablet (5 mg total) by mouth every 6 (six) hours as needed for severe pain.               Discharge Care Instructions  (From admission, onward)           Start     Ordered   10/06/21 0000  Discharge wound care:       Comments: Remove dressing 5 days after your surgery date. You can then wash the area gently with soap and water when in the shower and pat dry. You will have an incision check in about 1 week.   10/06/21 0850            Discharge home in stable condition Infant Feeding: Bottle and Breast Infant Disposition: home with mother Discharge instruction: per After Visit Summary and Postpartum booklet. Activity: Advance as tolerated. Pelvic rest for 6 weeks.  Diet: routine diet Future Appointments: Future Appointments  Date Time Provider [place]  10/12/2021  1:20 PM [place] None  11/23/2021  1:10 PM Chancy Milroy, MD [place] None   Follow up Visit: Message sent to [facility] by Dr. Cy Blamer on 6/13  Please schedule this patient for a In person postpartum visit in 6 weeks with the following provider: Any provider. Additional Postpartum F/U: Incision check 1 week  High risk pregnancy complicated by:  FGR and oligohydramnios Delivery mode:  C-Section, Low Transverse  Anticipated Birth Control:  IUD (would like Mirena IUD at 6 weeks PP)  10/06/2021 Genia Del, MD

## 2021-10-04 NOTE — Anesthesia Preprocedure Evaluation (Addendum)
Anesthesia Evaluation  Patient identified by MRN, date of birth, ID band Patient awake    Reviewed: Allergy & Precautions, NPO status , Patient's Chart, lab work & pertinent test results  History of Anesthesia Complications Negative for: history of anesthetic complications  Airway Mallampati: II  TM Distance: >3 FB Neck ROM: Full    Dental no notable dental hx.    Pulmonary neg pulmonary ROS,    Pulmonary exam normal        Cardiovascular negative cardio ROS Normal cardiovascular exam     Neuro/Psych negative neurological ROS  negative psych ROS   GI/Hepatic negative GI ROS, Neg liver ROS,   Endo/Other  negative endocrine ROS  Renal/GU negative Renal ROS  negative genitourinary   Musculoskeletal negative musculoskeletal ROS (+)   Abdominal   Peds  Hematology negative hematology ROS (+)   Anesthesia Other Findings Day of surgery medications reviewed with patient.  Reproductive/Obstetrics (+) Pregnancy (breech presentation, IUGR)                            Anesthesia Physical Anesthesia Plan  ASA: 2  Anesthesia Plan: Spinal   Post-op Pain Management: Ofirmev IV (intra-op)*   Induction:   PONV Risk Score and Plan: 3 and Treatment may vary due to age or medical condition, Dexamethasone and Ondansetron  Airway Management Planned: Natural Airway  Additional Equipment:   Intra-op Plan:   Post-operative Plan:   Informed Consent: I have reviewed the patients History and Physical, chart, labs and discussed the procedure including the risks, benefits and alternatives for the proposed anesthesia with the patient or authorized representative who has indicated his/her understanding and acceptance.       Plan Discussed with: CRNA  Anesthesia Plan Comments:       Anesthesia Quick Evaluation

## 2021-10-04 NOTE — Op Note (Signed)
Operative Note   Patient: Yolanda Valdez  Date of Procedure: 10/04/2021  Procedure: Primary Low Transverse Cesarean   Indications:  Breech presentation, oligohydramnios, FGR (10%ile)  Pre-operative Diagnosis: primary cesarean section for breech presentation and fetal growth restriction.   Post-operative Diagnosis: Same  TOLAC Candidate: Yes   Surgeon: Surgeon(s) and Role:    * Woodroe Mode, MD - Primary    * Renard Matter, MD - Fellow  Assistants: Renard Matter  An experienced assistant was required given the standard of surgical care given the complexity of the case.  This assistant was needed for exposure, dissection, suctioning, retraction, instrument exchange, assisting with delivery with administration of fundal pressure, and for overall help during the procedure.   Anesthesia: spinal  Anesthesiologist: Dr. Daiva Huge  Antibiotics: Cefazolin   Estimated Blood Loss: 220 ml   Total IV Fluids: 2300 ml  Urine Output:  300 cc OF clear urine  Specimens: placenta sent to L&D   Complications: no complications   Indications: Yolanda Valdez is a 21 y.o. G1P1001 with an IUP [redacted]w[redacted]d presenting for unscheduled, urgent cesarean secondary to the indications listed above. Clinical course notable for patient was at [place] office of growth [place] for S<D. Was newly diagnosed with FGR today (10%ile with AC < 2.8%ile) as well as oligohydramnios and fetus was in breech presentation. Due to FGR and oligohydramnios delivery was recommended and given breech presentation in primip with low fluid cesarean delivery was recommended   Findings: Viable infant in frank breech  presentation, no nuchal cord present. Apgars 8 , 9 , . Weight 2680 g . Clear amniotic fluid. Normal placenta, three vessel cord. Normal uterus, Normal bilateral fallopian tubes, Normal bilateral ovaries.  Procedure Details: A Time Out was held and the above information confirmed. The patient received intravenous antibiotics and had sequential  compression devices applied to her lower extremities preoperatively. The patient was taken back to the operative suite where spinal anesthesia was administered. After induction of anesthesia, the patient was draped and prepped in the usual sterile manner and placed in a dorsal supine position with a leftward tilt. A low transverse skin incision was made with scalpel and carried down through the subcutaneous tissue to the fascia. Fascial incision was made and extended transversely. The fascia was separated from the underlying rectus tissue superiorly and inferiorly. The rectus muscles were separated in the midline bluntly and the peritoneum was entered bluntly. An Alexis retractor was placed to aid in visualization of the uterus. The utero-vesical peritoneal reflection was incised transversely and the bladder flap was bluntly freed from the lower uterine segment. A low transverse uterine incision was made. The infant was successfully delivered from frank breech presentation by first bringing hips up to the hysterotomy, each leg was then flexed at the knee and delivered without difficulty, the arms were then flexed at the elbow and delivered and at that time head was easily delivered by gently lifting torso up. The umbilical cord was clamped after 1 minute. Cord ph was not sent, and cord blood was obtained for evaluation. The placenta was removed Intact and appeared normal. The uterine incision was closed with running locked sutures of 0-Vicryl, and then due to areas of bleeding a second imbricating layer was also placed with 0-Vicryl. Overall, excellent hemostasis was noted. The abdomen and the pelvis were cleared of all clot and debris and the Ubaldo Glassing was removed. Hemostasis was confirmed on all surfaces.  The peritoneum was reapproximated using 2-0 vicryl . The fascia was then closed  using 0 Vicryl in a running fashion. The subcutaneous layer was reapproximated with plain gut and the skin was closed with a 4-0  vicryl subcuticular stitch. The patient tolerated the procedure well. Sponge, lap, instrument and needle counts were correct x 2. She was taken to the recovery room in stable condition.  Disposition: PACU - hemodynamically stable.    Signed: Renard Matter, MD, MPH [facility])

## 2021-10-04 NOTE — Progress Notes (Signed)
MFM Brief Note  Yolanda Valdez is here at 16 w 6 d at the request of Yolanda Gula, MD for repeat growth due s<d.  She is overall doing well but complains of leakage of amniotic fluid and decreased fetal movement.,    Positive interval growth with measurements consistent with fetal growth restriction with an EFW 10% and AC 2.8%. Good fetal movement and but oligohydramnios.  I discussed with Yolanda Valdez that given oligohydramnios and new FGR we recommend delivery.   The fetus is in a breech position.  I discussed today's findings with the first attending today, she reported that she would also make Yolanda Valdez aware.  She will go directly to L&D for discussion and planning for delivery via ECV with IOL or cesarean delivery.  All questions answered.  I spent 20 minutes with > 50% in face to face consultation.  Yolanda Olive, MD

## 2021-10-05 ENCOUNTER — Encounter (HOSPITAL_COMMUNITY): Payer: Self-pay | Admitting: Obstetrics & Gynecology

## 2021-10-05 DIAGNOSIS — D62 Acute posthemorrhagic anemia: Secondary | ICD-10-CM | POA: Diagnosis not present

## 2021-10-05 LAB — CBC
HCT: 30 % — ABNORMAL LOW (ref 36.0–46.0)
Hemoglobin: 9.4 g/dL — ABNORMAL LOW (ref 12.0–15.0)
MCH: 24 pg — ABNORMAL LOW (ref 26.0–34.0)
MCHC: 31.3 g/dL (ref 30.0–36.0)
MCV: 76.5 fL — ABNORMAL LOW (ref 80.0–100.0)
Platelets: 253 10*3/uL (ref 150–400)
RBC: 3.92 MIL/uL (ref 3.87–5.11)
RDW: 14.6 % (ref 11.5–15.5)
WBC: 15 10*3/uL — ABNORMAL HIGH (ref 4.0–10.5)
nRBC: 0 % (ref 0.0–0.2)

## 2021-10-05 LAB — RPR: RPR Ser Ql: NONREACTIVE

## 2021-10-05 MED ORDER — IBUPROFEN 600 MG PO TABS
600.0000 mg | ORAL_TABLET | Freq: Four times a day (QID) | ORAL | Status: DC
  Administered 2021-10-05 – 2021-10-06 (×4): 600 mg via ORAL
  Filled 2021-10-05 (×4): qty 1

## 2021-10-05 MED ORDER — MENTHOL 3 MG MT LOZG
1.0000 | LOZENGE | OROMUCOSAL | Status: DC | PRN
Start: 2021-10-05 — End: 2021-10-06

## 2021-10-05 MED ORDER — KETOROLAC TROMETHAMINE 30 MG/ML IJ SOLN: 30.0000 mg | Freq: Four times a day (QID) | INTRAMUSCULAR | Status: AC | PRN

## 2021-10-05 MED ORDER — DIBUCAINE (PERIANAL) 1 % EX OINT: 1.0000 "application " | TOPICAL_OINTMENT | CUTANEOUS | Status: DC | PRN

## 2021-10-05 MED ORDER — ACETAMINOPHEN 500 MG PO TABS
1000.0000 mg | ORAL_TABLET | Freq: Four times a day (QID) | ORAL | Status: DC
  Administered 2021-10-05 – 2021-10-06 (×6): 1000 mg via ORAL
  Filled 2021-10-05 (×6): qty 2

## 2021-10-05 MED ORDER — PRENATAL MULTIVITAMIN CH
1.0000 | ORAL_TABLET | Freq: Every day | ORAL | Status: DC
  Filled 2021-10-05: qty 1

## 2021-10-05 MED ORDER — WITCH HAZEL-GLYCERIN EX PADS: 1.0000 "application " | MEDICATED_PAD | CUTANEOUS | Status: DC | PRN

## 2021-10-05 MED ORDER — SIMETHICONE 80 MG PO CHEW: 80.0000 mg | CHEWABLE_TABLET | ORAL | Status: DC | PRN

## 2021-10-05 MED ORDER — KETOROLAC TROMETHAMINE 30 MG/ML IJ SOLN
30.0000 mg | Freq: Four times a day (QID) | INTRAMUSCULAR | Status: AC
Start: 2021-10-05 — End: 2021-10-05
  Administered 2021-10-05 (×2): 30 mg via INTRAVENOUS
  Filled 2021-10-05 (×2): qty 1

## 2021-10-05 MED ORDER — SENNOSIDES-DOCUSATE SODIUM 8.6-50 MG PO TABS
2.0000 | ORAL_TABLET | Freq: Every day | ORAL | Status: DC
  Administered 2021-10-05 – 2021-10-06 (×2): 2 via ORAL
  Filled 2021-10-05 (×2): qty 2

## 2021-10-05 MED ORDER — FERROUS SULFATE 325 (65 FE) MG PO TABS
325.0000 mg | ORAL_TABLET | ORAL | Status: DC
  Administered 2021-10-05: 325 mg via ORAL
  Filled 2021-10-05: qty 1

## 2021-10-05 MED ORDER — ZOLPIDEM TARTRATE 5 MG PO TABS: 5.0000 mg | ORAL_TABLET | Freq: Every evening | ORAL | Status: DC | PRN

## 2021-10-05 MED ORDER — DIPHENHYDRAMINE HCL 25 MG PO CAPS: 25.0000 mg | ORAL_CAPSULE | Freq: Four times a day (QID) | ORAL | Status: DC | PRN

## 2021-10-05 MED ORDER — ENOXAPARIN SODIUM 40 MG/0.4ML IJ SOSY: 40.0000 mg | PREFILLED_SYRINGE | INTRAMUSCULAR | Status: DC

## 2021-10-05 MED ORDER — OXYCODONE HCL 5 MG PO TABS
5.0000 mg | ORAL_TABLET | Freq: Four times a day (QID) | ORAL | Status: DC | PRN
  Administered 2021-10-05: 10 mg via ORAL
  Administered 2021-10-06 (×2): 5 mg via ORAL
  Filled 2021-10-05 (×3): qty 1
  Filled 2021-10-05: qty 2

## 2021-10-05 MED ORDER — TETANUS-DIPHTH-ACELL PERTUSSIS 5-2.5-18.5 LF-MCG/0.5 IM SUSY: 0.5000 mL | PREFILLED_SYRINGE | Freq: Once | INTRAMUSCULAR | Status: DC

## 2021-10-05 MED ORDER — SIMETHICONE 80 MG PO CHEW
80.0000 mg | CHEWABLE_TABLET | Freq: Three times a day (TID) | ORAL | Status: DC
  Administered 2021-10-05 – 2021-10-06 (×4): 80 mg via ORAL
  Filled 2021-10-05 (×4): qty 1

## 2021-10-05 MED ORDER — COCONUT OIL OIL: 1.0000 "application " | TOPICAL_OIL | Status: DC | PRN

## 2021-10-05 MED ORDER — IRON SUCROSE 20 MG/ML IV SOLN
500.0000 mg | Freq: Once | INTRAVENOUS | Status: AC
  Administered 2021-10-05: 500 mg via INTRAVENOUS
  Filled 2021-10-05: qty 25

## 2021-10-05 MED ORDER — OXYTOCIN-SODIUM CHLORIDE 30-0.9 UT/500ML-% IV SOLN
2.5000 [IU]/h | INTRAVENOUS | Status: AC
  Administered 2021-10-05 (×2): 2.5 [IU]/h via INTRAVENOUS
  Filled 2021-10-05 (×2): qty 500

## 2021-10-05 NOTE — Lactation Note (Signed)
This note was copied from a baby's chart. Lactation Consultation Note  Patient Name: Yolanda Valdez QASTM'H Date: 10/05/2021 Reason for consult: Initial assessment;Difficult latch;1st time breastfeeding;Mother's request;Early term 37-38.6wks Age:22 hours LC entered room, infant was cuing to breastfeed. Per mom ,infant did not latch in recovery, RN taught her hand expression and few drops of colostrum was expressed in infant's mouth.  Mom latched infant on her left breast using the football hold position, infant latched with depth, swallows were heard and infant BF for 20 minutes. LC reviewed hand expression with breast model and mom self expressed 3 mls of colostrum that was spoon fed to infant. Infant appeared content after the feeding. Mom knows if infant is still cuing after latching at the first breast to offer the other within the same feeding.  Mom knows how to hand express if infant doesn't latch at the breast and spoon feed infant her EBM. Mom will continue to breastfeed infant on demand, by cues, 8 to 12+ times within 24 hours, skin to skin. Mom knows to call RN/LC for further latch assistance if needed. Mom made aware of O/P services, breastfeeding support groups, community resources, and our phone # for post-discharge questions.   Maternal Data Has patient been taught Hand Expression?: Yes (Mom was taught hand expression in recovery due infant not latching at the breast.) Does the patient have breastfeeding experience prior to this delivery?: No  Feeding Mother's Current Feeding Choice: Breast Milk and Formula  LATCH Score Latch: Grasps breast easily, tongue down, lips flanged, rhythmical sucking.  Audible Swallowing: Spontaneous and intermittent  Type of Nipple: Everted at rest and after stimulation  Comfort (Breast/Nipple): Soft / non-tender  Hold (Positioning): Assistance needed to correctly position infant at breast and maintain latch.  LATCH Score: 9   Lactation  Tools Discussed/Used    Interventions Interventions: Breast feeding basics reviewed;Assisted with latch;Skin to skin;Hand express;Breast compression;Adjust position;Support pillows;Position options;Expressed milk;Education;LC Services brochure  Discharge    Consult Status Consult Status: Follow-up Date: 10/05/21 Follow-up type: In-patient    Danelle Earthly 10/05/2021, 1:46 AM

## 2021-10-05 NOTE — Lactation Note (Signed)
This note was copied from a baby's chart. Lactation Consultation Note  Patient Name: Yolanda Valdez [id number] Date: 10/05/2021 Reason for consult: Follow-up assessment;Mother's request;Difficult latch;Primapara;1st time breastfeeding;Early term 37-38.6wks;Infant < 6lbs;Breastfeeding assistance Age:22 hours Mom requested LC services, LC entered room per mom, infant is sleeping now.  Mom will call [place] services later tonight if needed.  Maternal Data Has patient been taught Hand Expression?: Yes  Feeding Mother's Current Feeding Choice: Breast Milk  LATCH Score   Lactation Tools Discussed/Used Tools: Pump;Flanges Flange Size: 24 Breast pump type: Double-Electric Breast Pump Pump Education: Setup, frequency, and cleaning;Milk Storage Reason for Pumping: increase stimulation Pumping frequency: post pump after feeding for 15 mins  Interventions Discharge Pump: DEBP WIC Program: Yes  Consult Status Consult Status: Follow-up Date: 10/06/21 Follow-up type: In-patient    Vicente Serene 10/05/2021, 9:16 PM

## 2021-10-05 NOTE — Lactation Note (Signed)
This note was copied from a baby's chart. Lactation Consultation Note  Patient Name: Yolanda Valdez DTOIZ'T Date: 10/05/2021 Reason for consult: Follow-up assessment;Mother's request;Difficult latch;Primapara;1st time breastfeeding;Early term 37-38.6wks;Infant < 6lbs;Breastfeeding assistance Age:22 hours  LC assisted with latching infant at the breast with signs of milk transfer. Infant fed for 8 mins. Mom uterine cramping increased in intensity during latch. Mom to call for pain meds before feeding and pumping.   Mom like to Dad to assist with feeding. DEBP set up. Mom working with RN, Phillips Hay, to get pain control and fix her IV.  LC will return to review pump parts, usage and cleaning.   Plan 1.to feed based on cues 8-12x 24hr period. Mom to offer breasts and look for signs of milk transfer. 2. Mom to supplement with EBM less than 5 ml on spoon, if greater with slow flow nipple and pace bottle feeding. LPTI supplementation guideline provided and reviewed.  3. Post pump after each feeding for 15 mins on initial setting.   All questions answered at the end of the visit.   Maternal Data Has patient been taught Hand Expression?: Yes  Feeding Mother's Current Feeding Choice: Breast Milk  LATCH Score Latch: Repeated attempts needed to sustain latch, nipple held in mouth throughout feeding, stimulation needed to elicit sucking reflex.  Audible Swallowing: Spontaneous and intermittent  Type of Nipple: Everted at rest and after stimulation  Comfort (Breast/Nipple): Soft / non-tender  Hold (Positioning): Assistance needed to correctly position infant at breast and maintain latch.  LATCH Score: 8   Lactation Tools Discussed/Used Tools: Pump;Flanges Flange Size: 24 Breast pump type: Double-Electric Breast Pump Pump Education: Setup, frequency, and cleaning;Milk Storage Reason for Pumping: increase stimulation Pumping frequency: post pump after feeding for 15  mins  Interventions Interventions: Breast feeding basics reviewed;Assisted with latch;Skin to skin;Breast massage;Hand express;Breast compression;Adjust position;Support pillows;Position options;Expressed milk;DEBP;Education;Pace feeding;Infant Driven Feeding Algorithm education;LPT handout/interventions;LC Services brochure  Discharge Pump: DEBP WIC Program: Yes  Consult Status Consult Status: Follow-up Date: 10/06/21 Follow-up type: In-patient    Yolanda Valdez  Yolanda Valdez 10/05/2021, 5:49 PM

## 2021-10-05 NOTE — Progress Notes (Signed)
Called by RN to assess patient. Briefly patient is PPD#1 s/p LTCS for breech, FGR, and oligohydramnios.  Patient reports feeling dizzy constantly, fatigue, as well as "body feeling heavy". RN tried to help patient out of bed to go to bathroom, but patient was unable to tolerate due to dizziness and nausea. VSS, lochia normal, pain well controlled with PO medication. Hgb 11.0 on admission, down to 9.4 after surgery. Given symptoms, will order dose of Venofer and reassess.    Brand Males, CNM 10/05/21 12:18 PM

## 2021-10-05 NOTE — Progress Notes (Signed)
POSTPARTUM PROGRESS NOTE  Subjective: Yolanda Valdez is a 21 y.o. G1P1001 POD#1 s/p pLTCS at [redacted]w[redacted]d.  She reports she doing well. No acute events overnight. She denies any problems with ambulating, voiding or po intake. Denies nausea or vomiting. She has  passed flatus. Pain is well controlled.  Lochia is scant.  Objective: Blood pressure 113/76, pulse 90, temperature 97.9 F (36.6 C), temperature source Oral, resp. rate 20, height 5\' 4"  (1.626 m), weight 78 kg, last menstrual period 01/12/2021, SpO2 100 %, unknown if currently breastfeeding.  Physical Exam:  General: alert, cooperative and no distress Chest: no respiratory distress Abdomen: soft, non-tender  incision clean/dry/intact  Uterine Fundus: firm, appropriately tender Extremities: No calf swelling or tenderness  no edema  Recent Labs    10/04/21 1917 10/05/21 0551  HGB 11.0* 9.4*  HCT 34.9* 30.0*    Assessment/Plan: Yolanda Valdez is a 21 y.o. G1P1001 POD#1 s/p pLTCS at [redacted]w[redacted]d.  POD#1: Doing well, pain well-controlled. H/H appropriate 11>9.4 (PO iron) -- Routine postpartum care -- Encouraged up OOB -- Lovenox ordered for VTE prophylaxis however patient does not want anything derived from pork products. Will discuss with pharmacy regarding Lovenox pork derivatives. SCDs in place in the meantime.  Routine Postpartum Care -- Contraception: IUD outpatient.  -- Feeding: breast  Dispo: Plan for discharge POD#2 or 3 depending on patient status.  [redacted]w[redacted]d, MD, MPH OB Fellow, [facility] for [facility]

## 2021-10-05 NOTE — Progress Notes (Incomplete)
Post Operative Day 2 Subjective: Doing well. No acute events overnight. Pain is controlled and bleeding is appropriate. She is eating, drinking, voiding, and ambulating without issue. She is *** feeding which is going ***. She has no other concerns at this time.  Objective: Blood pressure 100/74, pulse 68, temperature (!) 97.5 F (36.4 C), temperature source Oral, resp. rate 18, height 5\' 4"  (1.626 m), weight 78 kg, last menstrual period 01/12/2021, SpO2 100 %, unknown if currently breastfeeding.  Physical Exam:  General: {Exam; general:16600} Lochia: {Desc; appropriate/inappropriate:30686} Uterine Fundus: {Desc; firm/soft:30687} Incision: {Exam; incision:13523} DVT Evaluation: {Exam; dvt:13533}  Recent Labs    10/04/21 1917 10/05/21 0551  HGB 11.0* 9.4*  HCT 34.9* 30.0*    Assessment/Plan: Yolanda Valdez is a 21 y.o. *** on PPD# *** s/p ***.  Progressing well. Meeting postpartum milestones. VSS. Continue routine postpartum care.  #Acute blood loss anemia: - Hgb 11 > 9.4 - Received IV Venofer; no symptoms - Will continue to monitor   Feeding: Breast  Contraception: IUD outpatient   DVT prophylaxis: SCDs. Discussed Lovenox indication ***.   Dispo: Plan for discharge on POD#3.    LOS: 1 day   Genia Del, MD  10/05/2021, 9:45 PM

## 2021-10-06 MED ORDER — FERROUS SULFATE 325 (65 FE) MG PO TABS: 325.0000 mg | ORAL_TABLET | ORAL | 0 refills | Status: DC

## 2021-10-06 MED ORDER — OXYCODONE HCL 5 MG PO TABS: 5.0000 mg | ORAL_TABLET | Freq: Four times a day (QID) | ORAL | 0 refills | Status: DC | PRN

## 2021-10-06 MED ORDER — ACETAMINOPHEN 500 MG PO TABS: 1000.0000 mg | ORAL_TABLET | Freq: Three times a day (TID) | ORAL | 0 refills | Status: DC | PRN

## 2021-10-06 MED ORDER — IBUPROFEN 600 MG PO TABS: 600.0000 mg | ORAL_TABLET | Freq: Four times a day (QID) | ORAL | 0 refills | Status: DC | PRN

## 2021-10-06 NOTE — Lactation Note (Signed)
This note was copied from a baby's chart. Lactation Consultation Note  Patient Name: Yolanda Valdez EQAST'M Date: 10/06/2021 Reason for consult: Primapara;Follow-up assessment;1st time breastfeeding;Early term 37-38.6wks;Infant weight loss Age:22 hours As LC entered the room baby awake, rooting, and fussy. LC offered to check diaper, changed a large transitional stool and large wet.  LC offered to assist to latch and mom receptive.  Baby latched after several attempts and fed well with swallows.  LC noted areola edema , and showed mom reverse pressure. Latch score 8.  LC reviewed BF D/C teaching for less than 6 pound infant and the importance of not going over 3 hours without feeding  The baby.  LC reviewed the LC plan - feed for 15 -20 mins , supplement and post pump both breast and then the next feeding switch to the  Other breast.  Per mom WIC called and she declined the appt because she didn't feel she could manage to get there so she plans to order a DEBP on [employer].  Per mom has not pumped with the DEBP in the room.  LC reviewed and showed mom and grandmother how to [employer].  Mom aware of the [facility] resources after D/C.  Maternal Data    Feeding Mother's Current Feeding Choice: Breast Milk and Formula Nipple Type: Extra Slow Flow  LATCH Score Latch: Repeated attempts needed to sustain latch, nipple held in mouth throughout feeding, stimulation needed to elicit sucking reflex.  Audible Swallowing: Spontaneous and intermittent  Type of Nipple: Everted at rest and after stimulation  Comfort (Breast/Nipple): Soft / non-tender  Hold (Positioning): Assistance needed to correctly position infant at breast and maintain latch.  LATCH Score: 8   Lactation Tools Discussed/Used Pump Education: Milk Storage  Interventions Interventions: Breast feeding basics reviewed;Skin to skin;Assisted with latch;Breast massage;Hand express;Reverse pressure;Breast compression;Adjust  position;Support pillows;Position options;Hand pump;DEBP;Education;LC Services brochure  Discharge Discharge Education: Engorgement and breast care;Warning signs for feeding baby Pump: Manual WIC Program: Yes  Consult Status Consult Status: Complete Date: 10/06/21    Kathrin Greathouse 10/06/2021, 12:00 PM

## 2021-10-11 ENCOUNTER — Encounter: Payer: 59 | Admitting: Advanced Practice Midwife

## 2021-10-12 ENCOUNTER — Other Ambulatory Visit: Payer: Self-pay | Admitting: Obstetrics and Gynecology

## 2021-10-12 ENCOUNTER — Ambulatory Visit (INDEPENDENT_AMBULATORY_CARE_PROVIDER_SITE_OTHER): Payer: 59

## 2021-10-12 ENCOUNTER — Ambulatory Visit: Payer: 59

## 2021-10-12 DIAGNOSIS — Z5189 Encounter for other specified aftercare: Secondary | ICD-10-CM

## 2021-10-12 MED ORDER — OXYCODONE HCL 5 MG PO TABS: 5.0000 mg | ORAL_TABLET | Freq: Four times a day (QID) | ORAL | 0 refills | Status: DC | PRN

## 2021-10-12 NOTE — Progress Notes (Addendum)
Subjective:     Yolanda Valdez is a 21 y.o. female who presents to the clinic 8 days status post  LTCS  for  wound check . Eating a regular diet without difficulty. Bowel movements are normal. Pain is not well controlled.  Medications being used: acetaminophen and ibuprofen (OTC).  The following portions of the patient's history were reviewed and updated as appropriate: allergies and current medications.    Objective:    BP 108/72   Pulse 99   Ht 5\' 4"  (1.626 m)   Wt 162 lb (73.5 kg)   LMP 01/12/2021   Breastfeeding Yes   BMI 27.81 kg/m  General:  alert  Abdomen: soft, bowel sounds active, non-tender  Incision:   healing well, no drainage, no erythema, no hernia, no seroma, no swelling, no dehiscence, incision well approximated     Assessment:    Doing well postoperatively.   Plan:    1. Continue any current medications. 2. Wound care discussed. 3. Activity restrictions: none 4. Anticipated return to work: not applicable. 5. Follow up: 6 weeks for Postpartum visit.   01/14/2021, RMA

## 2021-10-18 ENCOUNTER — Encounter: Payer: 59 | Admitting: Obstetrics and Gynecology

## 2021-10-19 ENCOUNTER — Inpatient Hospital Stay (HOSPITAL_COMMUNITY): Admit: 2021-10-19 | Payer: Self-pay

## 2021-11-03 ENCOUNTER — Ambulatory Visit: Payer: 59 | Admitting: Family

## 2021-11-23 ENCOUNTER — Encounter: Payer: Self-pay | Admitting: Obstetrics and Gynecology

## 2021-11-23 ENCOUNTER — Ambulatory Visit (INDEPENDENT_AMBULATORY_CARE_PROVIDER_SITE_OTHER): Payer: 59 | Admitting: Obstetrics and Gynecology

## 2021-11-23 NOTE — Patient Instructions (Signed)

## 2021-11-23 NOTE — Progress Notes (Signed)
    Post Partum Visit Note  Yolanda Valdez is a 21 y.o. G25P1001 female who presents for a postpartum visit. She is 7 weeks postpartum following a primary cesarean section.  I have fully reviewed the prenatal and intrapartum course. The delivery was at 37.6 gestational weeks.  Anesthesia: spinal. Postpartum course has been unremarkable. Baby is doing well. Baby is feeding by bottle Rush Barer soothe . Bleeding no bleeding. Bowel function is normal. Bladder function is normal. Patient is sexually active.  Contraception method is condoms.   Postpartum depression screening: negative.   The pregnancy intention screening data noted above was reviewed. Potential methods of contraception were discussed. The patient elected to proceed with No data recorded.    Health Maintenance Due  Topic Date Due   COVID-19 Vaccine (3 - Pfizer series) 01/02/2020   INFLUENZA VACCINE  11/22/2021    Medical records reviewed  Review of Systems Pertinent items noted in HPI and remainder of comprehensive ROS otherwise negative.  Objective:  LMP 01/12/2021    General:  alert   Breasts:  not indicated  Lungs: clear to auscultation bilaterally  Heart:  regular rate and rhythm, S1, S2 normal, no murmur, click, rub or gallop  Abdomen: soft, non-tender; bowel sounds normal; no masses,  no organomegaly   Wound well approximated incision  GU exam:  not indicated       Assessment:    There are no diagnoses linked to this encounter.  NL postpartum exam.   Plan:   Essential components of care per ACOG recommendations:  1.  Mood and well being: Patient with negative depression screening today. Reviewed local resources for support.  - Patient tobacco use? No.   - hx of drug use? No.    2. Infant care and feeding:  -Patient currently breastmilk feeding? No.  -Social determinants of health (SDOH) reviewed in EPIC. No concerns  3. Sexuality, contraception and birth spacing - Patient does not want a pregnancy in  the next year.  Desired family size is uncertain   - Reviewed reproductive life planning. Reviewed contraceptive methods based on pt preferences and effectiveness.  Patient desires IUD but had intercourse last evening  - Discussed birth spacing of 18 months  4. Sleep and fatigue -Encouraged family/partner/community support of 4 hrs of uninterrupted sleep to help with mood and fatigue  5. Physical Recovery  - Discussed patients delivery and complications. She describes her labor as good. - Patient had a C-section.  Perineal healing reviewed. Patient expressed understanding - Patient has urinary incontinence? No. - Patient is not safe to resume physical and sexual activity  6.  Health Maintenance - HM due items addressed Yes - Last pap smear  Diagnosis  Date Value Ref Range Status  03/30/2021 - Low grade squamous intraepithelial lesion (LSIL) (A)  Final   Pap smear not done at today's visit.  -Breast Cancer screening indicated? No.   7. Chronic Disease/Pregnancy Condition follow up: None  F/U in 2 weeks for IUD placement Pt advised to reframe from IC for 2 weeks Will need repeat pap smear in December  Nettie Elm, MD Center for [facility], [facility]

## 2021-12-05 ENCOUNTER — Ambulatory Visit (INDEPENDENT_AMBULATORY_CARE_PROVIDER_SITE_OTHER): Payer: Commercial Managed Care - HMO | Admitting: Advanced Practice Midwife

## 2021-12-05 ENCOUNTER — Encounter: Payer: Self-pay | Admitting: Pediatrics

## 2021-12-05 VITALS — BP 123/81 | HR 86 | Wt 161.0 lb

## 2021-12-05 DIAGNOSIS — Z975 Presence of (intrauterine) contraceptive device: Secondary | ICD-10-CM | POA: Diagnosis not present

## 2021-12-05 DIAGNOSIS — Z30430 Encounter for insertion of intrauterine contraceptive device: Secondary | ICD-10-CM | POA: Diagnosis not present

## 2021-12-05 LAB — POCT URINE PREGNANCY: Preg Test, Ur: NEGATIVE

## 2021-12-05 MED ORDER — LEVONORGESTREL 20 MCG/DAY IU IUD
1.0000 | INTRAUTERINE_SYSTEM | Freq: Once | INTRAUTERINE | Status: AC
  Administered 2021-12-05: 1 via INTRAUTERINE

## 2021-12-05 NOTE — Progress Notes (Signed)
Pt is in the office for IUD insertion.  Desires Mirena

## 2021-12-05 NOTE — Progress Notes (Signed)
   GYNECOLOGY OFFICE PROCEDURE NOTE  Yolanda Valdez is a 21 y.o. G1P1001 here for Liletta IUD insertion. No GYN concerns.  Last pap smear was on 03/30/21 and was abnormal with LSIL, plan for repeat pap 03/2022. Pt desires effective, long acting contraception with Mirena levonorgestrel IUD.   IUD Insertion Procedure Note Patient identified, informed consent performed, consent signed.   Discussed risks of irregular bleeding, cramping, infection, malpositioning or misplacement of the IUD outside the uterus which may require further procedure such as laparoscopy. Time out was performed.  Urine pregnancy test negative.  Speculum placed in the vagina.  Cervix visualized.  Cleaned with Betadine x 2.  Grasped anteriorly with a single tooth tenaculum.  Uterus sounded to 7 cm.  Mirena IUD placed per manufacturer's recommendations.  Strings trimmed to 3 cm. Tenaculum was removed, good hemostasis noted.  Patient tolerated procedure well.   Patient was given post-procedure instructions.  She was advised to have backup contraception for one week.  Patient was also asked to check IUD strings periodically and follow up in 4 weeks for IUD check.  No follow-ups on file.   Sharen Counter, CNM 8:58 AM

## 2022-01-03 ENCOUNTER — Encounter: Payer: Self-pay | Admitting: Advanced Practice Midwife

## 2022-01-03 ENCOUNTER — Ambulatory Visit (INDEPENDENT_AMBULATORY_CARE_PROVIDER_SITE_OTHER): Payer: Commercial Managed Care - HMO | Admitting: Advanced Practice Midwife

## 2022-01-03 VITALS — BP 109/70 | HR 75 | Ht 63.0 in | Wt 158.0 lb

## 2022-01-03 DIAGNOSIS — Z975 Presence of (intrauterine) contraceptive device: Secondary | ICD-10-CM | POA: Diagnosis not present

## 2022-01-03 DIAGNOSIS — N921 Excessive and frequent menstruation with irregular cycle: Secondary | ICD-10-CM | POA: Diagnosis not present

## 2022-01-03 MED ORDER — NORGESTIMATE-ETH ESTRADIOL 0.25-35 MG-MCG PO TABS: 1.0000 | ORAL_TABLET | Freq: Every day | ORAL | 2 refills | Status: DC

## 2022-01-03 MED ORDER — NORGESTIMATE-ETH ESTRADIOL 0.25-35 MG-MCG PO TABS: 1.0000 | ORAL_TABLET | Freq: Every day | ORAL | 1 refills | Status: DC

## 2022-01-03 NOTE — Progress Notes (Signed)
   GYNECOLOGY CLINIC PROGRESS NOTE  History:  21 y.o. G1P1001 here at [facility] today for today for IUD string check; Mirena IUD was placed  12/05/21. Pt with frequent bleeding, daily light bleeding, occasional heavier bleeding since IUD insertion.  The following portions of the patient's history were reviewed and updated as appropriate: allergies, current medications, past family history, past medical history, past social history, past surgical history and problem list. Last pap smear on 03/30/21 was abnormal with LSIL. Needs repeat Pap.  Review of Systems:  Pertinent items are noted in HPI.   Objective:  Physical Exam Blood pressure 109/70, pulse 75, height 5\' 3"  (1.6 m), weight 158 lb (71.7 kg), not currently breastfeeding. Gen: NAD Abd: Soft, nontender and nondistended Pelvic: Normal appearing external genitalia; normal appearing vaginal mucosa and cervix.  IUD strings visualized, about 3 cm in length outside cervix.   Assessment & Plan:  1. Breakthrough bleeding associated with intrauterine device (IUD) --Add OCPs x 1 --f/U in office in 3 months  --Need Pap at follow up visit  - norgestimate-ethinyl estradiol (ORTHO-CYCLEN) 0.25-35 MG-MCG tablet; Take 1 tablet by mouth daily.  Dispense: 28 tablet; Refill: 1   Return in about 6 months (around 07/04/2022) for Gyn follow up for Abnormal Uterine Bleeding.   09/03/2022, CNM 5:48 PM

## 2022-01-16 ENCOUNTER — Encounter: Payer: Self-pay | Admitting: *Deleted

## 2022-04-06 ENCOUNTER — Encounter: Payer: Self-pay | Admitting: *Deleted

## 2022-04-10 ENCOUNTER — Ambulatory Visit: Payer: Commercial Managed Care - HMO | Admitting: Family Medicine

## 2022-04-11 ENCOUNTER — Ambulatory Visit (INDEPENDENT_AMBULATORY_CARE_PROVIDER_SITE_OTHER): Payer: Commercial Managed Care - HMO | Admitting: Family Medicine

## 2022-04-11 ENCOUNTER — Encounter: Payer: Self-pay | Admitting: Family Medicine

## 2022-04-11 VITALS — BP 102/66 | HR 77 | Temp 97.3°F | Ht 63.0 in | Wt 158.6 lb

## 2022-04-11 DIAGNOSIS — M654 Radial styloid tenosynovitis [de Quervain]: Secondary | ICD-10-CM

## 2022-04-11 MED ORDER — DICLOFENAC SODIUM 75 MG PO TBEC: 75.0000 mg | DELAYED_RELEASE_TABLET | Freq: Two times a day (BID) | ORAL | 0 refills | Status: DC

## 2022-04-11 NOTE — Progress Notes (Signed)
   Yolanda Valdez is a 21 y.o. female who presents today for an office visit.  Assessment/Plan:  De Quervain's tenosynovitis No red flags.  Discussed home exercises and handout was given.  Will also start diclofenac 75 mg twice daily for the next couple of weeks.  She is not currently breast-feeding.  We discussed spica splint.  Also discussed ice.  She will let me know if not improving in the next couple of weeks and we can consider referral to PT or sports med.    Subjective:  HPI:  Patient here with left wrist pain. Started a few weeks ago. Comes and goes. No injuries or obvious precipitating events. Tried topical treatments. Tried some tylenol and ibuprofen without much improvement. They do have a newborn baby at home.        Objective:  Physical Exam: BP 102/66   Pulse 77   Temp (!) 97.3 F (36.3 C) (Temporal)   Ht 5\' 3"  (1.6 m)   Wt 158 lb 9.6 oz (71.9 kg)   SpO2 100%   BMI 28.09 kg/m   Gen: No acute distress, resting comfortably MSK: - Hand: No deformities.  Left radial styloid tender to palpation.  Positive Finkelstein test.  Good strength distally.  Normal cap refill distally. Neuro: Grossly normal, moves all extremities Psych: Normal affect and thought content      Yolanda Valdez M. , MD 04/11/2022 7:45 AM

## 2022-04-11 NOTE — Patient Instructions (Addendum)
It was very nice to see you today!  You have tendonitis in your thumb.   Please start the diclofenac 75 mg twice daily.  Please put ice on the area 2-3 times per day.  Work on the exercises.  Wear the splint if you can.  Let me know if not proving in the next 1 to 2 weeks.  Take care, Dr [name]  PLEASE NOTE:  If you had any lab tests, please let us know if you have not heard back within a few days. You may see your results on mychart before we have a chance to review them but we will give you a call once they are reviewed by [place].   If we ordered any referrals today, please let us know if you have not heard from their office within the next week.   If you had any urgent prescriptions sent in today, please check with the pharmacy within an hour of our visit to make sure the prescription was transmitted appropriately.   Please try these tips to maintain a healthy lifestyle:  Eat at least 3 REAL meals and 1-2 snacks per day.  Aim for no more than 5 hours between eating.  If you eat breakfast, please do so within one hour of getting up.  + Each meal should contain half fruits/vegetables, one quarter protein, and one quarter carbs (no bigger than a computer mouse)  Cut down on sweet beverages. This includes juice, soda, and sweet tea.   Drink at least 1 glass of water with each meal and aim for at least 8 glasses per day  Exercise at least 150 minutes every week.

## 2022-04-27 ENCOUNTER — Other Ambulatory Visit: Payer: Self-pay | Admitting: Family Medicine

## 2022-08-25 ENCOUNTER — Encounter: Payer: 59 | Admitting: Family Medicine

## 2022-09-08 ENCOUNTER — Ambulatory Visit (INDEPENDENT_AMBULATORY_CARE_PROVIDER_SITE_OTHER): Payer: 59 | Admitting: Family Medicine

## 2022-09-08 VITALS — BP 105/65 | HR 86 | Temp 97.8°F | Ht 63.0 in | Wt 157.6 lb

## 2022-09-08 DIAGNOSIS — R5383 Other fatigue: Secondary | ICD-10-CM

## 2022-09-08 DIAGNOSIS — Z0001 Encounter for general adult medical examination with abnormal findings: Secondary | ICD-10-CM | POA: Diagnosis not present

## 2022-09-08 DIAGNOSIS — Z13220 Encounter for screening for lipoid disorders: Secondary | ICD-10-CM

## 2022-09-08 DIAGNOSIS — Z131 Encounter for screening for diabetes mellitus: Secondary | ICD-10-CM

## 2022-09-08 NOTE — Progress Notes (Signed)
Chief Complaint:  Yolanda Valdez is a 22 y.o. female who presents today for her annual comprehensive physical exam.    Assessment/Plan:  Neck Pain No red flags.  Likely mild muscular strain.  Discussed home exercises and handout was given.  Can also use a heating pad as needed.  Other fatigue Likely multifactorial.  Will check labs today including CBC, c-Met, TSH, B12, and vitamin D.  Also she is getting poor quality sleep which is likely contributing as well.  If labs are negative may consider sleep study.  Preventative Healthcare: Check labs.  Follows with OB/GYN for women's health.  Patient Counseling(The following topics were reviewed and/or handout was given):  -Nutrition: Stressed importance of moderation in sodium/caffeine intake, saturated fat and cholesterol, caloric balance, sufficient intake of fresh fruits, vegetables, and fiber.  -Stressed the importance of regular exercise.   -Substance Abuse: Discussed cessation/primary prevention of tobacco, alcohol, or other drug use; driving or other dangerous activities under the influence; availability of treatment for abuse.   -Injury prevention: Discussed safety belts, safety helmets, smoke detector, smoking near bedding or upholstery.   -Sexuality: Discussed sexually transmitted diseases, partner selection, use of condoms, avoidance of unintended pregnancy and contraceptive alternatives.   -Dental health: Discussed importance of regular tooth brushing, flossing, and dental visits.  -Health maintenance and immunizations reviewed. Please refer to Health maintenance section.  Return to care in 1 year for next preventative visit.     Subjective:  HPI:  She has no acute complaints today.   She is here today with her husband.  Her primary concern today is fatigue.  This has been going on for several months.  She feels tired after doing her normal household chores.  Also admits that she is not sleeping well.  She does have a newborn at  home and this has been going well.  She thinks her symptoms started since before having anyone.  She would like to have blood work done today.  Over the last few months also occasionally gets neck pain when looking down.  No pain at rest.  Pain does not radiate.  No treatments tried.  No obvious injuries or precipitating events.  Lifestyle Diet: Balanced. Plenty of fruits and vegetables.  Exercise: None specific.      09/08/2022   10:16 AM  Depression screen PHQ 2/9  Decreased Interest 0  Down, Depressed, Hopeless 0  PHQ - 2 Score 0    There are no preventive care reminders to display for this patient.   ROS: Per HPI, otherwise a complete review of systems was negative.   PMH:  The following were reviewed and entered/updated in epic: Past Medical History:  Diagnosis Date   Medical history non-contributory    Patient Active Problem List   Diagnosis Date Noted   IUD (intrauterine device) in place 12/05/2021   Past Surgical History:  Procedure Laterality Date   CESAREAN SECTION N/A 10/04/2021   Procedure: CESAREAN SECTION;  Surgeon: Adam Phenix, MD;  Location: [facility] LD ORS;  Service: Obstetrics;  Laterality: N/A;   NO PAST SURGERIES      Family History  Problem Relation Age of Onset   Hypertension Mother    Diabetes type II Mother    Diabetes Mother    Hypertension Paternal Grandfather    Diabetes Paternal Grandfather     Medications- reviewed and updated Current Outpatient Medications  Medication Sig Dispense Refill   levonorgestrel (MIRENA, 52 MG,) 20 MCG/DAY IUD 1 each by Intrauterine route once.  Placed 11/2021     No current facility-administered medications for this visit.    Allergies-reviewed and updated Allergies  Allergen Reactions   Beef-Derived Products    Chicken Protein    Fish-Derived Products    Pork-Derived Products     Social History   Socioeconomic History   Marital status: Married    Spouse name: Not on file   Number of children: Not  on file   Years of education: Not on file   Highest education level: Not on file  Occupational History   Not on file  Tobacco Use   Smoking status: Never   Smokeless tobacco: Never  Vaping Use   Vaping Use: Never used  Substance and Sexual Activity   Alcohol use: Never   Drug use: Never   Sexual activity: Yes    Partners: Male    Birth control/protection: I.U.D.  Other Topics Concern   Not on file  Social History Narrative   ** Merged History Encounter **       Social Determinants of Health   Financial Resource Strain: Not on file  Food Insecurity: Not on file  Transportation Needs: Not on file  Physical Activity: Not on file  Stress: Not on file  Social Connections: Not on file        Objective:  Physical Exam: BP 105/65   Pulse 86   Temp 97.8 F (36.6 C) (Temporal)   Ht 5\' 3"  (1.6 m)   Wt 157 lb 9.6 oz (71.5 kg)   SpO2 98%   BMI 27.92 kg/m   Body mass index is 27.92 kg/m. Wt Readings from Last 3 Encounters:  09/08/22 157 lb 9.6 oz (71.5 kg)  04/11/22 158 lb 9.6 oz (71.9 kg)  01/03/22 158 lb (71.7 kg)   Gen: NAD, resting comfortably HEENT: TMs normal bilaterally. OP clear. No thyromegaly noted.  CV: RRR with no murmurs appreciated Pulm: NWOB, CTAB with no crackles, wheezes, or rhonchi GI: Normal bowel sounds present. Soft, Nontender, Nondistended. MSK: no edema, cyanosis, or clubbing noted - Neck: No deformities.  Tenderness palpation along left-sided cervical paraspinal muscles.  Full range of motion.  Neurovascular intact distally Skin: warm, dry Neuro: CN2-12 grossly intact. Strength 5/5 in upper and lower extremities. Reflexes symmetric and intact bilaterally.  Psych: Normal affect and thought content     Yolanda Valdez M. [name], MD 09/08/2022 11:00 AM

## 2022-09-08 NOTE — Addendum Note (Signed)
Addended by: Dyann Kief on: 09/08/2022 11:09 AM   Modules accepted: Orders

## 2022-09-08 NOTE — Patient Instructions (Addendum)
It was very nice to see you today!  We we will check blood work today.  Please continue to work on diet and exercise.  Return in about 1 year (around 09/08/2023) for Annual Physical.   Take care, Dr [name]  PLEASE NOTE:  If you had any lab tests, please let us know if you have not heard back within a few days. You may see your results on mychart before we have a chance to review them but we will give you a call once they are reviewed by [place].   If we ordered any referrals today, please let us know if you have not heard from their office within the next week.   If you had any urgent prescriptions sent in today, please check with the pharmacy within an hour of our visit to make sure the prescription was transmitted appropriately.   Please try these tips to maintain a healthy lifestyle:  Eat at least 3 REAL meals and 1-2 snacks per day.  Aim for no more than 5 hours between eating.  If you eat breakfast, please do so within one hour of getting up.   Each meal should contain half fruits/vegetables, one quarter protein, and one quarter carbs (no bigger than a computer mouse)  Cut down on sweet beverages. This includes juice, soda, and sweet tea.   Drink at least 1 glass of water with each meal and aim for at least 8 glasses per day  Exercise at least 150 minutes every week.    Preventive Care 55-52 Years Old, Female Preventive care refers to lifestyle choices and visits with your health care provider that can promote health and wellness. Preventive care visits are also called wellness exams. What can I expect for my preventive care visit? Counseling During your preventive care visit, your health care provider may ask about your: Medical history, including: Past medical problems. Family medical history. Pregnancy history. Current health, including: Menstrual cycle. Method of birth control. Emotional well-being. Home life and relationship well-being. Sexual activity and sexual  health. Lifestyle, including: Alcohol, nicotine or tobacco, and drug use. Access to firearms. Diet, exercise, and sleep habits. Work and work Astronomer. Sunscreen use. Safety issues such as seatbelt and bike helmet use. Physical exam Your health care provider may check your: Height and weight. These may be used to calculate your BMI (body mass index). BMI is a measurement that tells if you are at a healthy weight. Waist circumference. This measures the distance around your waistline. This measurement also tells if you are at a healthy weight and may help predict your risk of certain diseases, such as type 2 diabetes and high blood pressure. Heart rate and blood pressure. Body temperature. Skin for abnormal spots. What immunizations do I need?  Vaccines are usually given at various ages, according to a schedule. Your health care provider will recommend vaccines for you based on your age, medical history, and lifestyle or other factors, such as travel or where you work. What tests do I need? Screening Your health care provider may recommend screening tests for certain conditions. This may include: Pelvic exam and Pap test. Lipid and cholesterol levels. Diabetes screening. This is done by checking your blood sugar (glucose) after you have not eaten for a while (fasting). Hepatitis B test. Hepatitis C test. HIV (human immunodeficiency virus) test. STI (sexually transmitted infection) testing, if you are at risk. BRCA-related cancer screening. This may be done if you have a family history of breast, ovarian, tubal, or peritoneal  cancers. Talk with your health care provider about your test results, treatment options, and if necessary, the need for more tests. Follow these instructions at home: Eating and drinking  Eat a healthy diet that includes fresh fruits and vegetables, whole grains, lean protein, and low-fat dairy products. Take vitamin and mineral supplements as recommended by  your health care provider. Do not drink alcohol if: Your health care provider tells you not to drink. You are pregnant, may be pregnant, or are planning to become pregnant. If you drink alcohol: Limit how much you have to 0-1 drink a day. Know how much alcohol is in your drink. In the [place] one drink equals one 12 oz bottle of beer (355 mL), one 5 oz glass of wine (148 mL), or one 1 oz glass of hard liquor (44 mL). Lifestyle Brush your teeth every morning and night with fluoride toothpaste. Floss one time each day. Exercise for at least 30 minutes 5 or more days each week. Do not use any products that contain nicotine or tobacco. These products include cigarettes, chewing tobacco, and vaping devices, such as e-cigarettes. If you need help quitting, ask your health care provider. Do not use drugs. If you are sexually active, practice safe sex. Use a condom or other form of protection to prevent STIs. If you do not wish to become pregnant, use a form of birth control. If you plan to become pregnant, see your health care provider for a prepregnancy visit. Find healthy ways to manage stress, such as: Meditation, yoga, or listening to music. Journaling. Talking to a trusted person. Spending time with friends and family. Minimize exposure to UV radiation to reduce your risk of skin cancer. Safety Always wear your seat belt while driving or riding in a vehicle. Do not drive: If you have been drinking alcohol. Do not ride with someone who has been drinking. If you have been using any mind-altering substances or drugs. While texting. When you are tired or distracted. Wear a helmet and other protective equipment during sports activities. If you have firearms in your house, make sure you follow all gun safety procedures. Seek help if you have been physically or sexually abused. What's next? Go to your health care provider once a year for an annual wellness visit. Ask your health care provider  how often you should have your eyes and teeth checked. Stay up to date on all vaccines. This information is not intended to replace advice given to you by your health care provider. Make sure you discuss any questions you have with your health care provider. Document Revised: 10/06/2020 Document Reviewed: 10/06/2020 Elsevier Patient Education  2023 [employer].

## 2022-09-11 ENCOUNTER — Other Ambulatory Visit (INDEPENDENT_AMBULATORY_CARE_PROVIDER_SITE_OTHER): Payer: 59

## 2022-09-11 DIAGNOSIS — Z13220 Encounter for screening for lipoid disorders: Secondary | ICD-10-CM | POA: Diagnosis not present

## 2022-09-11 DIAGNOSIS — R5383 Other fatigue: Secondary | ICD-10-CM

## 2022-09-11 DIAGNOSIS — Z131 Encounter for screening for diabetes mellitus: Secondary | ICD-10-CM | POA: Diagnosis not present

## 2022-09-11 DIAGNOSIS — Z0001 Encounter for general adult medical examination with abnormal findings: Secondary | ICD-10-CM

## 2022-09-11 LAB — CBC
HCT: 43.5 % (ref 36.0–46.0)
Hemoglobin: 14.5 g/dL (ref 12.0–15.0)
MCHC: 33.4 g/dL (ref 30.0–36.0)
MCV: 86.6 fl (ref 78.0–100.0)
Platelets: 264 10*3/uL (ref 150.0–400.0)
RBC: 5.02 Mil/uL (ref 3.87–5.11)
RDW: 13.8 % (ref 11.5–15.5)
WBC: 6.7 10*3/uL (ref 4.0–10.5)

## 2022-09-11 LAB — LIPID PANEL
Cholesterol: 145 mg/dL (ref 0–200)
HDL: 36.7 mg/dL — ABNORMAL LOW (ref >39.00)
LDL Cholesterol: 95 mg/dL (ref 0–99)
NonHDL: 108.45
Total CHOL/HDL Ratio: 4
Triglycerides: 65 mg/dL (ref 0.0–149.0)
VLDL: 13 mg/dL (ref 0.0–40.0)

## 2022-09-11 LAB — URINALYSIS, ROUTINE W REFLEX MICROSCOPIC
Bilirubin Urine: NEGATIVE
Ketones, ur: NEGATIVE
Nitrite: NEGATIVE
Specific Gravity, Urine: 1.01 (ref 1.000–1.030)
Total Protein, Urine: NEGATIVE
Urine Glucose: NEGATIVE
Urobilinogen, UA: 0.2 (ref 0.0–1.0)
pH: 6.5 (ref 5.0–8.0)

## 2022-09-11 LAB — COMPREHENSIVE METABOLIC PANEL
ALT: 9 U/L (ref 0–35)
AST: 12 U/L (ref 0–37)
Albumin: 4 g/dL (ref 3.5–5.2)
Alkaline Phosphatase: 57 U/L (ref 39–117)
BUN: 12 mg/dL (ref 6–23)
CO2: 25 mEq/L (ref 19–32)
Calcium: 9.4 mg/dL (ref 8.4–10.5)
Chloride: 104 mEq/L (ref 96–112)
Creatinine, Ser: 0.54 mg/dL (ref 0.40–1.20)
GFR: 130.38 mL/min (ref >60.00)
Glucose, Bld: 82 mg/dL (ref 70–99)
Potassium: 4.2 mEq/L (ref 3.5–5.1)
Sodium: 137 mEq/L (ref 135–145)
Total Bilirubin: 0.5 mg/dL (ref 0.2–1.2)
Total Protein: 6.9 g/dL (ref 6.0–8.3)

## 2022-09-11 LAB — HEMOGLOBIN A1C: Hgb A1c MFr Bld: 5.3 % (ref 4.6–6.5)

## 2022-09-12 LAB — VITAMIN D 25 HYDROXY (VIT D DEFICIENCY, FRACTURES): VITD: 16.91 ng/mL — ABNORMAL LOW (ref 30.00–100.00)

## 2022-09-12 LAB — VITAMIN B12: Vitamin B-12: 104 pg/mL — ABNORMAL LOW (ref 211–911)

## 2022-09-12 LAB — TSH: TSH: 2.82 u[IU]/mL (ref 0.35–5.50)

## 2022-09-13 NOTE — Progress Notes (Signed)
She has a few abnormalities in the labs that could explain her symptoms  Her B12 is low.  Recommend that she start the B12 protocol here.  Vitamin D is also low.  Recommend starting 50,000 IUs once weekly.  We should recheck again in 3 to 6 months is a bit low.    Her "good" cholesterol  is a bit low.  She should work on diet and exercise and we can recheck this in a year.   She did have a small amount of blood in the urine.  This may have been a false positive.  Please have her come back to check send out urinalysis  The rest of her labs are all normal.

## 2022-09-15 ENCOUNTER — Other Ambulatory Visit: Payer: Self-pay | Admitting: *Deleted

## 2022-09-15 ENCOUNTER — Telehealth: Payer: Self-pay | Admitting: Family Medicine

## 2022-09-15 DIAGNOSIS — R319 Hematuria, unspecified: Secondary | ICD-10-CM

## 2022-09-15 MED ORDER — VITAMIN D (ERGOCALCIFEROL) 1.25 MG (50000 UNIT) PO CAPS: 50000.0000 [IU] | ORAL_CAPSULE | ORAL | 0 refills | Status: DC

## 2022-09-15 NOTE — Telephone Encounter (Signed)
Pt would like a call back with lab results 

## 2022-09-19 ENCOUNTER — Ambulatory Visit (INDEPENDENT_AMBULATORY_CARE_PROVIDER_SITE_OTHER): Payer: 59

## 2022-09-19 ENCOUNTER — Other Ambulatory Visit: Payer: Self-pay | Admitting: Family Medicine

## 2022-09-19 ENCOUNTER — Other Ambulatory Visit: Payer: Self-pay

## 2022-09-19 DIAGNOSIS — R319 Hematuria, unspecified: Secondary | ICD-10-CM

## 2022-09-19 DIAGNOSIS — E538 Deficiency of other specified B group vitamins: Secondary | ICD-10-CM | POA: Diagnosis not present

## 2022-09-19 LAB — URINALYSIS, ROUTINE W REFLEX MICROSCOPIC
Bilirubin Urine: NEGATIVE
Hgb urine dipstick: NEGATIVE
Ketones, ur: NEGATIVE
Leukocytes,Ua: NEGATIVE
Nitrite: NEGATIVE
RBC / HPF: NONE SEEN (ref 0–?)
Specific Gravity, Urine: <=1.005 — AB (ref 1.000–1.030)
Total Protein, Urine: NEGATIVE
Urine Glucose: NEGATIVE
Urobilinogen, UA: 0.2 (ref 0.0–1.0)
WBC, UA: NONE SEEN (ref 0–?)
pH: 7 (ref 5.0–8.0)

## 2022-09-19 MED ORDER — CYANOCOBALAMIN 1000 MCG/ML IJ SOLN
1000.0000 ug | Freq: Once | INTRAMUSCULAR | Status: AC
Start: 2022-09-19 — End: 2022-09-19
  Administered 2022-09-19: 1000 ug via INTRAMUSCULAR

## 2022-09-19 NOTE — Telephone Encounter (Signed)
Test was done today. Will call when results are send to PCP

## 2022-09-19 NOTE — Addendum Note (Signed)
Addended by: Laddie Aquas A on: 09/19/2022 12:53 PM   Modules accepted: Orders

## 2022-09-19 NOTE — Progress Notes (Signed)
Pt is here for B12 shot for Dr. [name], injection given in right deltoid. Pt tolerated well.

## 2022-09-20 NOTE — Progress Notes (Signed)
Urine sample is normal. Do not need to do any further testing.  Yolanda Valdez. [name], MD 09/20/2022 7:57 AM

## 2022-09-26 ENCOUNTER — Ambulatory Visit: Payer: 59

## 2022-09-27 ENCOUNTER — Ambulatory Visit (INDEPENDENT_AMBULATORY_CARE_PROVIDER_SITE_OTHER): Payer: 59 | Admitting: *Deleted

## 2022-09-27 DIAGNOSIS — E538 Deficiency of other specified B group vitamins: Secondary | ICD-10-CM

## 2022-09-27 MED ORDER — CYANOCOBALAMIN 1000 MCG/ML IJ SOLN
1000.0000 ug | Freq: Once | INTRAMUSCULAR | Status: AC
Start: 2022-09-27 — End: 2022-09-27
  Administered 2022-09-27: 1000 ug via INTRAMUSCULAR

## 2022-09-27 NOTE — Progress Notes (Signed)
Patient presents for B12 injection today. Patient received her B12 injection in Right Deltoid. Patient tolerated injection well.  Documentation entered in [facility] in Epic[employer].   

## 2022-10-04 ENCOUNTER — Ambulatory Visit (INDEPENDENT_AMBULATORY_CARE_PROVIDER_SITE_OTHER): Payer: 59 | Admitting: *Deleted

## 2022-10-04 ENCOUNTER — Ambulatory Visit: Payer: 59

## 2022-10-04 DIAGNOSIS — E538 Deficiency of other specified B group vitamins: Secondary | ICD-10-CM

## 2022-10-04 MED ORDER — CYANOCOBALAMIN 1000 MCG/ML IJ SOLN
1000.0000 ug | Freq: Once | INTRAMUSCULAR | Status: AC
Start: 2022-10-04 — End: 2022-10-04
  Administered 2022-10-04: 1000 ug via INTRAMUSCULAR

## 2022-10-04 NOTE — Progress Notes (Signed)
Patient presents for B12 injection today. Patient received her B12 injection in Right Deltoid. Patient tolerated injection well.  Documentation entered in [facility] in Epic[employer].   

## 2022-10-11 ENCOUNTER — Ambulatory Visit: Payer: 59

## 2022-10-11 ENCOUNTER — Ambulatory Visit (INDEPENDENT_AMBULATORY_CARE_PROVIDER_SITE_OTHER): Payer: 59

## 2022-10-11 DIAGNOSIS — E538 Deficiency of other specified B group vitamins: Secondary | ICD-10-CM | POA: Diagnosis not present

## 2022-10-11 MED ORDER — CYANOCOBALAMIN 1000 MCG/ML IJ SOLN
1000.0000 ug | Freq: Once | INTRAMUSCULAR | Status: AC
Start: 2022-10-11 — End: 2022-10-11
  Administered 2022-10-11: 1000 ug via INTRAMUSCULAR

## 2022-10-11 NOTE — Progress Notes (Signed)
Pt here for B12 injection for Jacquiline Doe, MD. Injection given in Right deltoid. Pt tolerated well.

## 2022-10-17 ENCOUNTER — Other Ambulatory Visit: Payer: Self-pay | Admitting: Family Medicine

## 2022-10-25 DIAGNOSIS — Z01419 Encounter for gynecological examination (general) (routine) without abnormal findings: Secondary | ICD-10-CM | POA: Diagnosis not present

## 2022-10-25 DIAGNOSIS — Z319 Encounter for procreative management, unspecified: Secondary | ICD-10-CM | POA: Diagnosis not present

## 2022-10-25 DIAGNOSIS — Z30432 Encounter for removal of intrauterine contraceptive device: Secondary | ICD-10-CM | POA: Diagnosis not present

## 2022-10-25 DIAGNOSIS — Z113 Encounter for screening for infections with a predominantly sexual mode of transmission: Secondary | ICD-10-CM | POA: Diagnosis not present

## 2022-10-25 DIAGNOSIS — Z6827 Body mass index (BMI) 27.0-27.9, adult: Secondary | ICD-10-CM | POA: Diagnosis not present

## 2022-10-25 DIAGNOSIS — E539 Vitamin B deficiency, unspecified: Secondary | ICD-10-CM | POA: Diagnosis not present

## 2022-11-07 ENCOUNTER — Ambulatory Visit (INDEPENDENT_AMBULATORY_CARE_PROVIDER_SITE_OTHER): Payer: 59 | Admitting: *Deleted

## 2022-11-07 DIAGNOSIS — E538 Deficiency of other specified B group vitamins: Secondary | ICD-10-CM

## 2022-11-07 MED ORDER — CYANOCOBALAMIN 1000 MCG/ML IJ SOLN
1000.0000 ug | Freq: Once | INTRAMUSCULAR | Status: AC
Start: 2022-11-07 — End: 2022-11-07
  Administered 2022-11-07: 1000 ug via INTRAMUSCULAR

## 2022-11-07 NOTE — Progress Notes (Signed)
Patient presents for B12 injection today. Patient received her B12 injection in Right Deltoid. Patient tolerated injection well.  Documentation entered in [facility] in Epic[employer].   

## 2022-11-22 ENCOUNTER — Encounter (INDEPENDENT_AMBULATORY_CARE_PROVIDER_SITE_OTHER): Payer: Self-pay

## 2022-12-12 ENCOUNTER — Ambulatory Visit (INDEPENDENT_AMBULATORY_CARE_PROVIDER_SITE_OTHER): Payer: 59 | Admitting: *Deleted

## 2022-12-12 DIAGNOSIS — E538 Deficiency of other specified B group vitamins: Secondary | ICD-10-CM | POA: Diagnosis not present

## 2022-12-12 MED ORDER — CYANOCOBALAMIN 1000 MCG/ML IJ SOLN
1000.0000 ug | Freq: Once | INTRAMUSCULAR | Status: AC
Start: 2022-12-12 — End: 2022-12-12
  Administered 2022-12-12: 1000 ug via INTRAMUSCULAR

## 2022-12-12 NOTE — Progress Notes (Signed)
Patient presents for B12 injection today. Patient received her B12 injection in Right Deltoid. Patient tolerated injection well.  Documentation entered in [facility] in Epic[employer].   

## 2023-01-01 ENCOUNTER — Other Ambulatory Visit: Payer: Self-pay

## 2023-01-01 ENCOUNTER — Emergency Department (HOSPITAL_BASED_OUTPATIENT_CLINIC_OR_DEPARTMENT_OTHER): Admission: EM | Admit: 2023-01-01 | Discharge: 2023-01-01 | Discharge status: Against Medical Advice | Payer: 59

## 2023-01-01 DIAGNOSIS — R109 Unspecified abdominal pain: Secondary | ICD-10-CM | POA: Insufficient documentation

## 2023-01-01 DIAGNOSIS — R112 Nausea with vomiting, unspecified: Secondary | ICD-10-CM | POA: Insufficient documentation

## 2023-01-01 DIAGNOSIS — Z5321 Procedure and treatment not carried out due to patient leaving prior to being seen by health care provider: Secondary | ICD-10-CM | POA: Insufficient documentation

## 2023-01-01 LAB — COMPREHENSIVE METABOLIC PANEL
ALT: 9 U/L (ref 0–44)
AST: 13 U/L — ABNORMAL LOW (ref 15–41)
Albumin: 4.3 g/dL (ref 3.5–5.0)
Alkaline Phosphatase: 50 U/L (ref 38–126)
Anion gap: 7 (ref 5–15)
BUN: 10 mg/dL (ref 6–20)
CO2: 26 mmol/L (ref 22–32)
Calcium: 9.2 mg/dL (ref 8.9–10.3)
Chloride: 103 mmol/L (ref 98–111)
Creatinine, Ser: 0.54 mg/dL (ref 0.44–1.00)
GFR, Estimated: >60 mL/min (ref >60)
Glucose, Bld: 91 mg/dL (ref 70–99)
Potassium: 3.8 mmol/L (ref 3.5–5.1)
Sodium: 136 mmol/L (ref 135–145)
Total Bilirubin: 0.3 mg/dL (ref 0.3–1.2)
Total Protein: 7.1 g/dL (ref 6.5–8.1)

## 2023-01-01 LAB — URINALYSIS, ROUTINE W REFLEX MICROSCOPIC
Bilirubin Urine: NEGATIVE
Glucose, UA: NEGATIVE mg/dL
Hgb urine dipstick: NEGATIVE
Ketones, ur: NEGATIVE mg/dL
Leukocytes,Ua: NEGATIVE
Nitrite: NEGATIVE
Protein, ur: NEGATIVE mg/dL
Specific Gravity, Urine: 1.007 (ref 1.005–1.030)
pH: 7.5 (ref 5.0–8.0)

## 2023-01-01 LAB — CBC
HCT: 44.3 % (ref 36.0–46.0)
Hemoglobin: 14.8 g/dL (ref 12.0–15.0)
MCH: 29 pg (ref 26.0–34.0)
MCHC: 33.4 g/dL (ref 30.0–36.0)
MCV: 86.7 fL (ref 80.0–100.0)
Platelets: 258 10*3/uL (ref 150–400)
RBC: 5.11 MIL/uL (ref 3.87–5.11)
RDW: 14.3 % (ref 11.5–15.5)
WBC: 9.7 10*3/uL (ref 4.0–10.5)
nRBC: 0 % (ref 0.0–0.2)

## 2023-01-01 LAB — PREGNANCY, URINE: Preg Test, Ur: NEGATIVE

## 2023-01-01 LAB — LIPASE, BLOOD: Lipase: 36 U/L (ref 11–51)

## 2023-01-01 MED ORDER — ONDANSETRON 4 MG PO TBDP
4.0000 mg | ORAL_TABLET | Freq: Once | ORAL | Status: AC
  Administered 2023-01-01: 4 mg via ORAL
  Filled 2023-01-01: qty 1

## 2023-01-01 NOTE — ED Triage Notes (Signed)
Abd pain. +N/+V. Started 3-4 hrs ago. Felt like heartburn took pepto and soda- N/V began. Afebrile. Denies chills, body aches.   LMP 8/26

## 2023-01-09 ENCOUNTER — Ambulatory Visit (INDEPENDENT_AMBULATORY_CARE_PROVIDER_SITE_OTHER): Payer: 59 | Admitting: Family Medicine

## 2023-01-09 ENCOUNTER — Encounter: Payer: Self-pay | Admitting: Family Medicine

## 2023-01-09 ENCOUNTER — Ambulatory Visit: Payer: 59

## 2023-01-09 VITALS — BP 102/70 | HR 73 | Temp 97.8°F | Ht 63.0 in | Wt 157.4 lb

## 2023-01-09 DIAGNOSIS — E538 Deficiency of other specified B group vitamins: Secondary | ICD-10-CM | POA: Diagnosis not present

## 2023-01-09 DIAGNOSIS — E559 Vitamin D deficiency, unspecified: Secondary | ICD-10-CM

## 2023-01-09 DIAGNOSIS — R519 Headache, unspecified: Secondary | ICD-10-CM

## 2023-01-09 LAB — FOLATE: Folate: 13.5 ng/mL (ref >5.9)

## 2023-01-09 LAB — IBC + FERRITIN
Ferritin: 22.5 ng/mL (ref 10.0–291.0)
Iron: 65 ug/dL (ref 42–145)
Saturation Ratios: 19 % — ABNORMAL LOW (ref 20.0–50.0)
TIBC: 341.6 ug/dL (ref 250.0–450.0)
Transferrin: 244 mg/dL (ref 212.0–360.0)

## 2023-01-09 LAB — VITAMIN D 25 HYDROXY (VIT D DEFICIENCY, FRACTURES): VITD: 20.98 ng/mL — ABNORMAL LOW (ref 30.00–100.00)

## 2023-01-09 LAB — VITAMIN B12: Vitamin B-12: 191 pg/mL — ABNORMAL LOW (ref 211–911)

## 2023-01-09 MED ORDER — CYANOCOBALAMIN 1000 MCG/ML IJ SOLN
1000.0000 ug | Freq: Once | INTRAMUSCULAR | Status: AC
Start: 2023-01-09 — End: 2023-01-09
  Administered 2023-01-09: 1000 ug via INTRAMUSCULAR

## 2023-01-09 NOTE — Assessment & Plan Note (Signed)
She has completed B12 injection protocol.  Initially did very well when she was getting once weekly injections however once she switched to monthly injections she has had a return of symptoms including fatigue and headache.  We will recheck B12, folate, and MMA today.  If she still has laboratory evidence of B12 deficiency we will restart B12 injections.

## 2023-01-09 NOTE — Progress Notes (Signed)
   Yolanda Valdez is a 22 y.o. female who presents today for an office visit.  Assessment/Plan:  New/Acute Problems: Headache No red flags.  Reassuring exam.  May be due to her B12 deficiency as it appears that her symptoms worsened the last couple of months when she switched to once monthly injections.  Will be addressing B12 deficiency as below.  If her labs are normal and symptoms persist would consider imaging vs referral to neurology.  Due to normal neurologic exam today do not think we need to do any urgent imaging at this point.  We discussed reasons to return to care and seek emergent care.  Chronic Problems Addressed Today: B12 deficiency She has completed B12 injection protocol.  Initially did very well when she was getting once weekly injections however once she switched to monthly injections she has had a return of symptoms including fatigue and headache.  We will recheck B12, folate, and MMA today.  If she still has laboratory evidence of B12 deficiency we will restart B12 injections.   Vitamin D deficiency She is on prescription strength vitamin D 50,000 IUs once weekly.  We will recheck today.  Vitamin D level 16.9 when checked a few months ago.      Subjective:  HPI:  See assessment / plan for status of chronic conditions.  Patient here today for follow-up.  We last saw her 4 months ago for her annual physical.  At that visit she was having significant issues with ongoing fatigue.  We had checked labs which were significant for low vitamin D and B12.  We started her on B12 replacement protocol and vitamin D supplementation.  She was initially on B12 injections weekly and did very well with this with significant improvement in her symptoms.  For the last few months she has only been getting once monthly injection has had significant return of symptoms.  She is now having significant fatigue as well as headaches nearly every day.  No vision changes.  She occasionally takes Tylenol to  help with headaches.  No fevers or chills.  No weakness or numbness.  No tingling.  No recent illnesses.  She is trying to stay well-hydrated get plenty of fluids daily does admit she does not sleep as well as that she should.  She does occasionally have some dizziness and feeling off balance.  Also occasionally gets some muscle aches and joint pains as well.        Objective:  Physical Exam: BP 102/70   Pulse 73   Temp 97.8 F (36.6 C) (Temporal)   Ht 5\' 3"  (1.6 m)   Wt 157 lb 6.4 oz (71.4 kg)   LMP 12/18/2022 (Exact Date)   SpO2 98%   BMI 27.88 kg/m   Gen: No acute distress, resting comfortably CV: Regular rate and rhythm with no murmurs appreciated Pulm: Normal work of breathing, clear to auscultation bilaterally with no crackles, wheezes, or rhonchi Neuro: Cranial nerves II through XII intact.  Finger-finger testing intact bilaterally.  Strength 5 out of 5 in upper and lower extremities.  Sensation light touch intact throughout.  Reflexes 2+ and symmetric bilaterally. Psych: Normal affect and thought content      Ivanna Kocak M. [name], MD 01/09/2023 9:09 AM

## 2023-01-09 NOTE — Assessment & Plan Note (Signed)
She is on prescription strength vitamin D 50,000 IUs once weekly.  We will recheck today.  Vitamin D level 16.9 when checked a few months ago.

## 2023-01-09 NOTE — Patient Instructions (Signed)
It was very nice to see you today!  I think your B12 is still low.  We will check blood work today.  Return if symptoms worsen or fail to improve.   Take care, Dr [name]  PLEASE NOTE:  If you had any lab tests, please let us know if you have not heard back within a few days. You may see your results on mychart before we have a chance to review them but we will give you a call once they are reviewed by [place].   If we ordered any referrals today, please let us know if you have not heard from their office within the next week.   If you had any urgent prescriptions sent in today, please check with the pharmacy within an hour of our visit to make sure the prescription was transmitted appropriately.   Please try these tips to maintain a healthy lifestyle:  Eat at least 3 REAL meals and 1-2 snacks per day.  Aim for no more than 5 hours between eating.  If you eat breakfast, please do so within one hour of getting up.   Each meal should contain half fruits/vegetables, one quarter protein, and one quarter carbs (no bigger than a computer mouse)  Cut down on sweet beverages. This includes juice, soda, and sweet tea.   Drink at least 1 glass of water with each meal and aim for at least 8 glasses per day  Exercise at least 150 minutes every week.

## 2023-01-09 NOTE — Addendum Note (Signed)
Addended by: Dyann Kief on: 01/09/2023 11:15 AM   Modules accepted: Orders

## 2023-01-12 LAB — METHYLMALONIC ACID, SERUM: Methylmalonic Acid, Quant: 151 nmol/L (ref 55–335)

## 2023-01-12 NOTE — Progress Notes (Signed)
Her B12 is still not at goal.  We need to restart her injections.  She can come here to have this done weekly or we can send in a prescription of her to do this at home.  Once we get her B12 was improved I believe this will help with her other symptoms.  Her vitamin D is still a bit low.  Recommend that she restart vitamin D 50,000 IUs once weekly.  Please send a new prescription for this.  We should recheck her B12 and vitamin D again in 3 months.

## 2023-01-15 ENCOUNTER — Telehealth: Payer: Self-pay | Admitting: Family Medicine

## 2023-01-15 ENCOUNTER — Other Ambulatory Visit: Payer: Self-pay | Admitting: *Deleted

## 2023-01-15 MED ORDER — VITAMIN D (ERGOCALCIFEROL) 1.25 MG (50000 UNIT) PO CAPS: 50000.0000 [IU] | ORAL_CAPSULE | ORAL | 0 refills | Status: DC

## 2023-01-15 NOTE — Telephone Encounter (Signed)
See results note.

## 2023-01-15 NOTE — Telephone Encounter (Signed)
Pt would like a call back with lab results

## 2023-01-16 NOTE — Progress Notes (Signed)
Yes it is ok for her to take vitamin D.  Katina Degree. [name], MD 01/16/2023 7:36 AM

## 2023-01-17 ENCOUNTER — Ambulatory Visit: Payer: 59

## 2023-01-17 ENCOUNTER — Ambulatory Visit (INDEPENDENT_AMBULATORY_CARE_PROVIDER_SITE_OTHER): Payer: 59 | Admitting: *Deleted

## 2023-01-17 DIAGNOSIS — E538 Deficiency of other specified B group vitamins: Secondary | ICD-10-CM | POA: Diagnosis not present

## 2023-01-17 MED ORDER — CYANOCOBALAMIN 1000 MCG/ML IJ SOLN: INTRAMUSCULAR | 0 refills | Status: DC

## 2023-01-17 MED ORDER — "LUER LOCK SAFETY SYRINGES 25G X 1"" 3 ML MISC": 0 refills | Status: DC

## 2023-01-17 MED ORDER — CYANOCOBALAMIN 1000 MCG/ML IJ SOLN
1000.0000 ug | Freq: Once | INTRAMUSCULAR | Status: AC
Start: 2023-01-17 — End: 2023-01-17
  Administered 2023-01-17: 1000 ug via INTRAMUSCULAR

## 2023-01-17 NOTE — Progress Notes (Signed)
Patient presents for B12 injection today. Patient received her B12 injection in Right Deltoid. Patient tolerated injection well.  Documentation entered in [facility] in EpicCare.

## 2023-02-04 ENCOUNTER — Other Ambulatory Visit: Payer: Self-pay | Admitting: Family Medicine

## 2023-03-20 DIAGNOSIS — N911 Secondary amenorrhea: Secondary | ICD-10-CM | POA: Diagnosis not present

## 2023-03-20 DIAGNOSIS — Z9889 Other specified postprocedural states: Secondary | ICD-10-CM | POA: Diagnosis not present

## 2023-03-26 DIAGNOSIS — Z3481 Encounter for supervision of other normal pregnancy, first trimester: Secondary | ICD-10-CM | POA: Diagnosis not present

## 2023-03-26 DIAGNOSIS — Z3685 Encounter for antenatal screening for Streptococcus B: Secondary | ICD-10-CM | POA: Diagnosis not present

## 2023-03-26 LAB — HEPATITIS C ANTIBODY: HCV Ab: NEGATIVE

## 2023-03-26 LAB — OB RESULTS CONSOLE HIV ANTIBODY (ROUTINE TESTING): HIV: NONREACTIVE

## 2023-03-26 LAB — OB RESULTS CONSOLE RPR: RPR: NONREACTIVE

## 2023-03-26 LAB — OB RESULTS CONSOLE ANTIBODY SCREEN: Antibody Screen: NEGATIVE

## 2023-03-26 LAB — OB RESULTS CONSOLE HEPATITIS B SURFACE ANTIGEN: Hepatitis B Surface Ag: NEGATIVE

## 2023-03-26 LAB — OB RESULTS CONSOLE RUBELLA ANTIBODY, IGM: Rubella: IMMUNE

## 2023-03-30 LAB — OB RESULTS CONSOLE GC/CHLAMYDIA
Chlamydia: NEGATIVE
Neisseria Gonorrhea: NEGATIVE

## 2023-04-07 IMAGING — US US MFM OB FOLLOW-UP
1 series · 14 of 28 positions shown · non-contrast
Comparison: none

[Series 1: us mfm ob follow-up · 71 acquisitions, 14 frames shown]
[im 3/71]
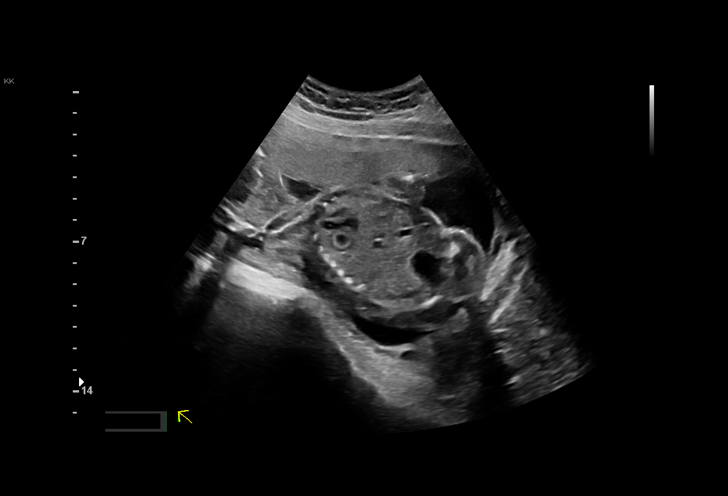
[im 8/71]
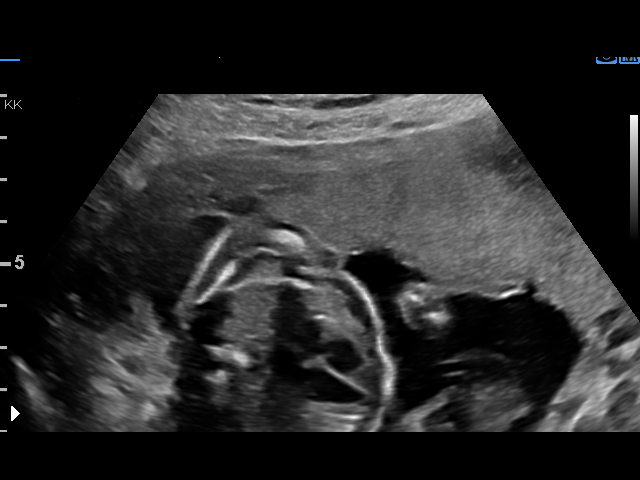
[im 13/71]
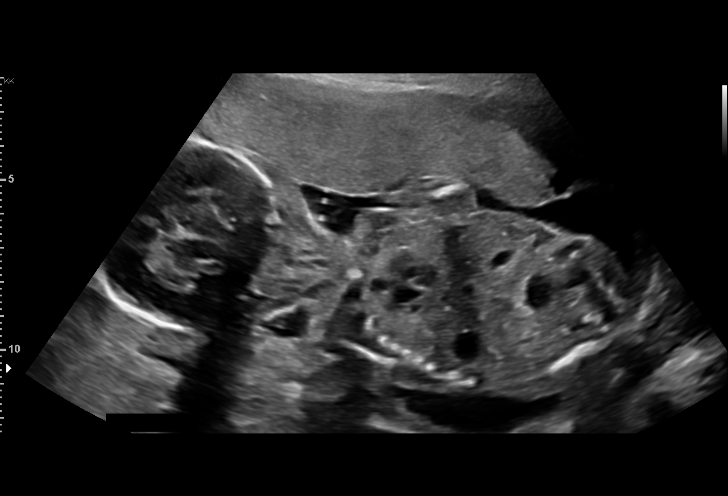
[im 19/71]
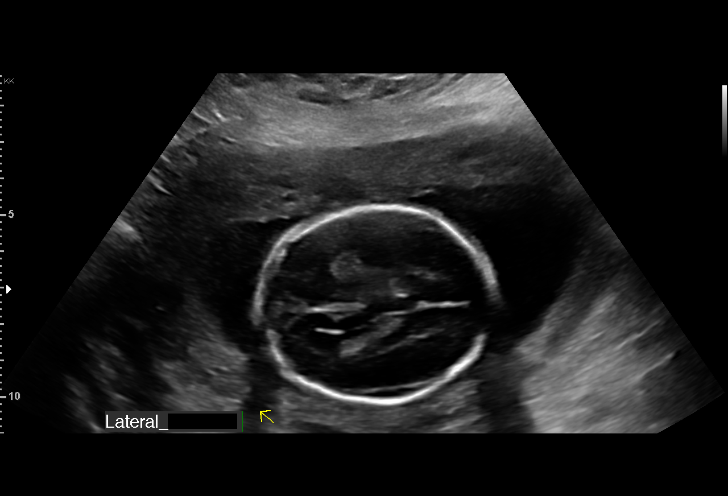
[im 24/71]
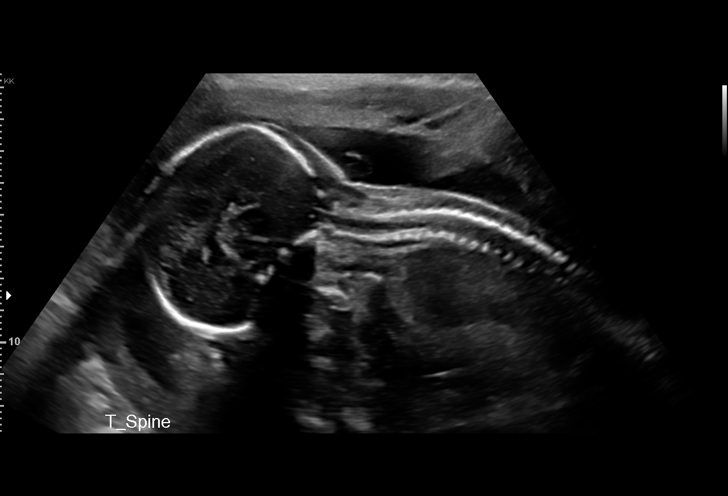
[im 29/71]
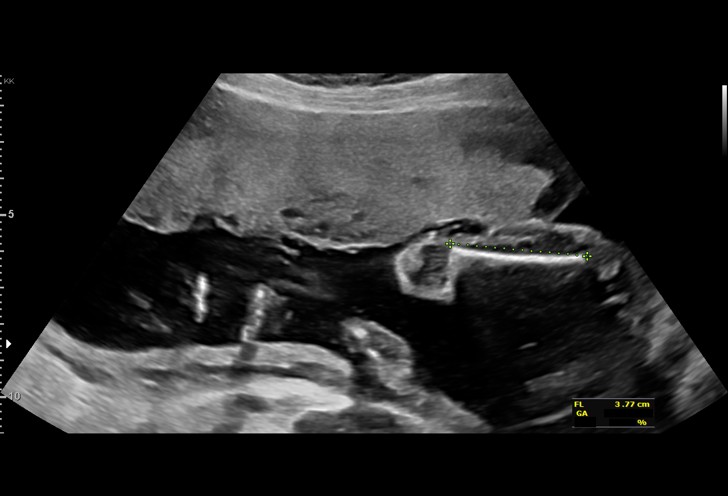
[im 34/71]
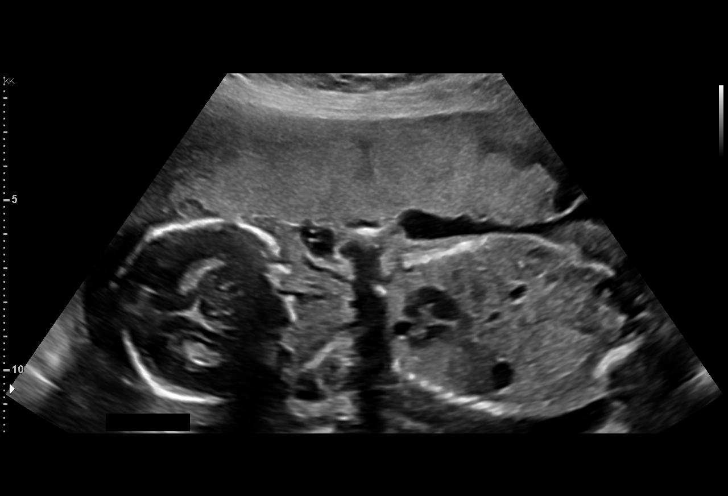
[im 39/71]
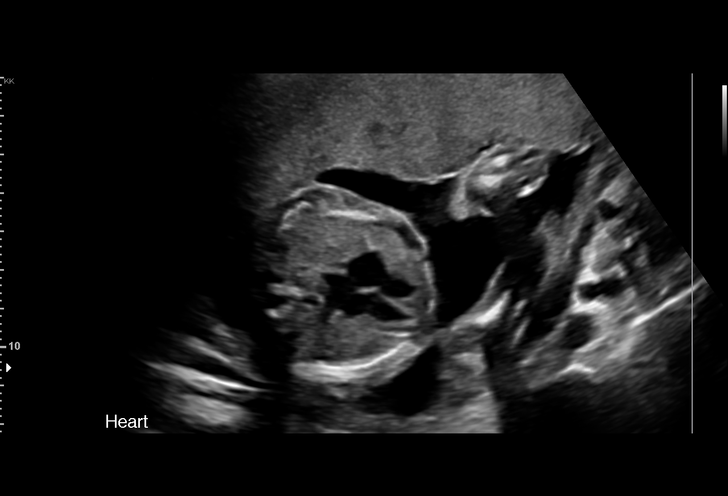
[im 45/71]
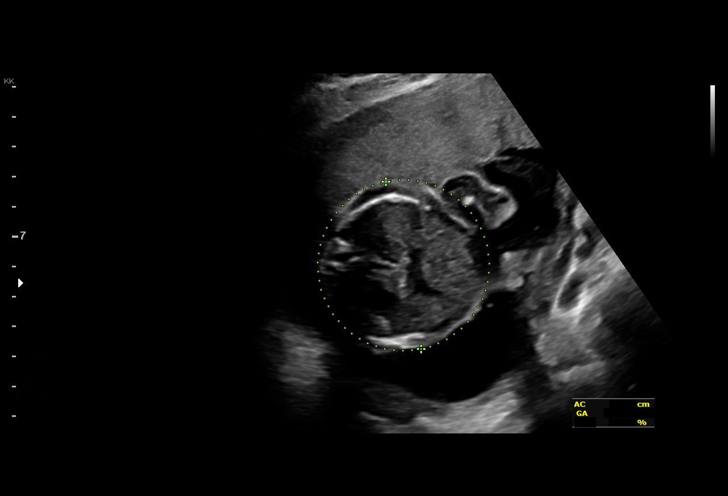
[im 50/71]
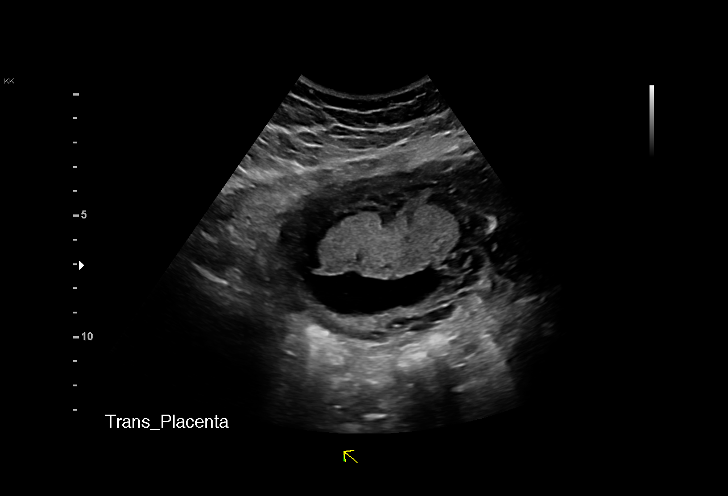
[im 55/71]
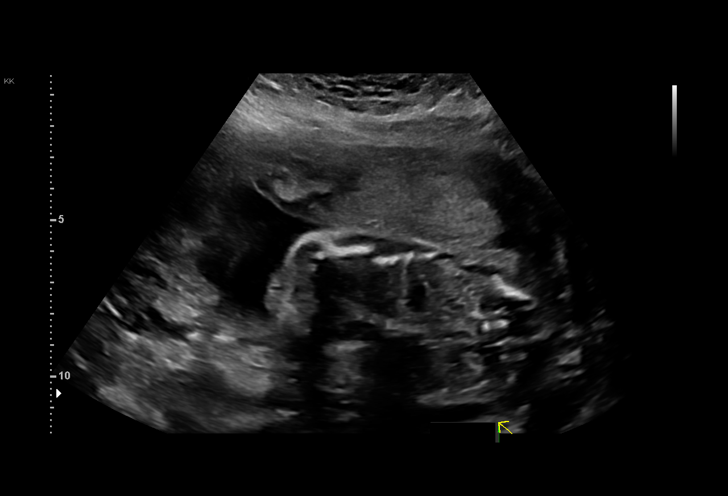
[im 60/71]
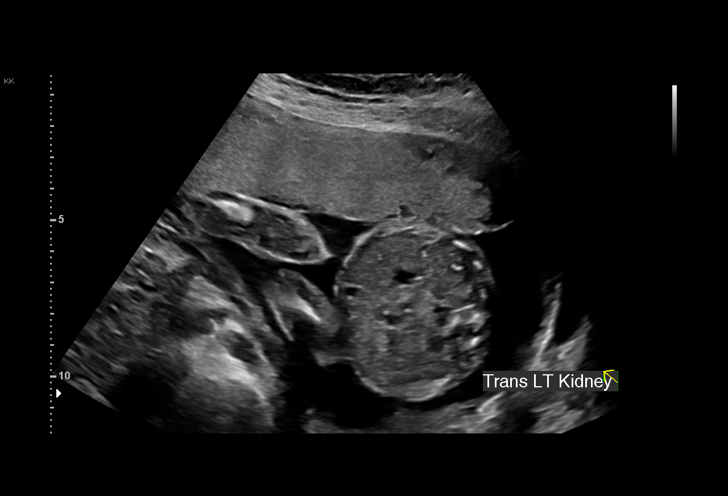
[im 65/71]
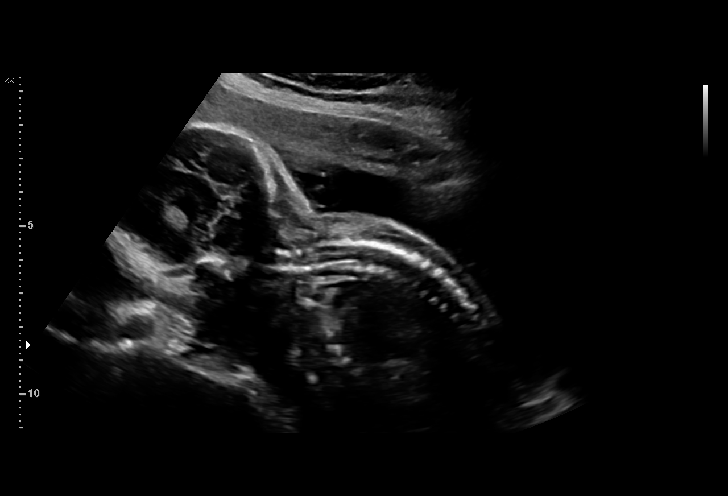
[im 71/71]
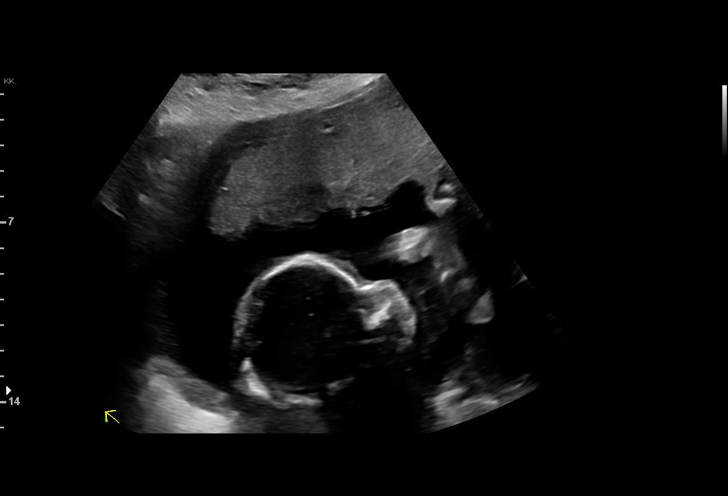

[14 of 28 positions shown; findings below may reference images not displayed]

MOOLMAN

Indications

 Antenatal follow-up for nonvisualized fetal
 anatomy
 22 weeks gestation of pregnancy
 Low risk NIPS Negative Horizon
Fetal Evaluation

 Num Of Fetuses:         1
 Fetal Heart Rate(bpm):  162
 Cardiac Activity:       Observed
 Presentation:           Frank breech
 Placenta:               Anterior
 P. Cord Insertion:      Previously Visualized

 Amniotic Fluid
 AFI FV:      Within normal limits

                             Largest Pocket(cm)

Biometry

 BPD:      54.7  mm     G. Age:  22w 5d         44  %    CI:        74.47   %    70 - 86
                                                         FL/HC:      19.2   %    19.2 -
 HC:      201.2  mm     G. Age:  22w 2d         20  %    HC/AC:      1.13        1.05 -
 AC:      178.4  mm     G. Age:  22w 5d         42  %    FL/BPD:     70.6   %    71 - 87
 FL:       38.6  mm     G. Age:  22w 3d         28  %    FL/AC:      21.6   %    20 - 24
 Est. FW:     513  gm      1 lb 2 oz     34  %
OB History

 Gravidity:    1         Term:   0        Prem:   0        SAB:   0
 TOP:          0       Ectopic:  0        Living: 0
Gestational Age

 LMP:           22w 5d        Date:  01/12/21                 EDD:   10/19/21
 U/S Today:     22w 4d                                        EDD:   10/20/21
 Best:          22w 5d     Det. By:  LMP  (01/12/21)          EDD:   10/19/21
Anatomy

 Cranium:               Appears normal         LVOT:                   Previously seen
 Cavum:                 Appears normal         Aortic Arch:            Previously seen
 Ventricles:            Appears normal         Ductal Arch:            Appears normal
 Choroid Plexus:        Previously seen        Diaphragm:              Appears normal
 Cerebellum:            Appears normal         Stomach:                Appears normal, left
                                                                       sided
 Posterior Fossa:       Appears normal         Abdomen:                Appears normal
 Nuchal Fold:           Previously seen        Abdominal Wall:         Previously seen
 Face:                  Orbits and profile     Cord Vessels:           Previously seen
                        previously seen
 Lips:                  Previously seen        Kidneys:                Appear normal
 Palate:                Previously seen        Bladder:                Appears normal
 Thoracic:              Appears normal         Spine:                  Ltd views no
                                                                       intracranial signs of
                                                                       NTD
 Heart:                 Appears normal         Upper Extremities:      Previously seen
                        (4CH, axis, and
                        situs)
 RVOT:                  Previously seen        Lower Extremities:      Previously seen

 Other:  Fetus appears to be female. Heels and 5th digit previously visualized.
         Technically difficult due to fetal position.
Cervix Uterus Adnexa

 Cervix
 Length:           3.07  cm.
 Normal appearance by transabdominal scan.
Impression

 Patient returned for completion of fetal anatomy .Amniotic
 fluid is normal and good fetal activity is seen .Fetal biometry
 is consistent with her previously-established dates .Fetal
 anatomical survey was completed and appears normal.
Recommendations

 Follow-up scans as clinically indicated.
                 Manek, Nutsi

## 2023-04-25 NOTE — L&D Delivery Note (Signed)
 Operative Note  PROCEDURE DATE: 09/17/23   PREOPERATIVE DIAGNOSIS: History of prior C section, ROM, labor   POSTOPERATIVE DIAGNOSIS: The same   PROCEDURE:    Repeat Low Transverse Cesarean Section   SURGEON:  Dr. Isidor Marek   INDICATIONS: This is a 23 y.o. yo G2P2002 at [redacted]w[redacted]d requiring cesarean section secondary to ruptured membranes, labor, and history of prior C section.   Decision made to proceed with LTCS. The risks of cesarean section discussed with the patient included but were not limited to: bleeding which may require transfusion or reoperation; infection which may require antibiotics; injury to bowel, bladder, ureters or other surrounding organs; injury to the fetus; need for additional procedures including hysterectomy in the event of a life-threatening hemorrhage; placental abnormalities wth subsequent pregnancies, incisional problems, thromboembolic phenomenon and other postoperative/anesthesia complications. The patient agreed with the proposed plan, giving informed consent for the procedure.     FINDINGS:  Viable female infant in vertex presentation, APGARs per NICU,  Weight pending, Amniotic fluid clear,  Intact placenta, three vessel cord.  Grossly normal uterus  .   ANESTHESIA:    Epidural ESTIMATED BLOOD LOSS: Pend SPECIMENS: Placenta for Labor and delivery COMPLICATIONS: None immediate    PROCEDURE IN DETAIL:  The patient received intravenous antibiotics (2g Ancef  and 500 mg Azithromycin ) and had sequential compression devices applied to her lower extremities while in the preoperative area.  She was then taken to the operating room where epidural anesthesia was dosed up to surgical level and was found to be adequate. She was then placed in a dorsal supine position with a leftward tilt, and prepped and draped in a sterile manner.  A foley catheter was placed into her bladder and attached to constant gravity.  After an adequate timeout was performed, a Pfannenstiel skin  incision was made with scalpel and carried through to the underlying layer of fascia. The fascia was incised in the midline and this incision was extended bilaterally using the Mayo scissors. Kocher clamps were applied to the superior aspect of the fascial incision and the underlying rectus muscles were dissected off bluntly. A similar process was carried out on the inferior aspect of the facial incision. The rectus muscles were separated in the midline bluntly and the peritoneum was entered bluntly.  A bladder flap was created sharply and developed bluntly. A transverse hysterotomy was made with a scalpel and extended bilaterally bluntly. The bladder blade was then removed. The infant was successfully delivered, and cord was clamped and cut and infant was handed over to awaiting neonatology team. Uterine massage was then administered and the placenta delivered intact with three-vessel cord. Cord gases. The uterus was cleared of clot and debris.  The hysterotomy was closed with 0 vicryl.  A second imbricating suture of 0-vicryl was used to reinforce the incision and aid in hemostasis.The fascia was closed with 0-Vicryl in a running fashion with good restoration of anatomy.  The subcutaneus tissue was irrigated and was reapproximated using running plain gut.  The skin was closed with 4-0 Vicryl in a subcuticular fashion.  All surgical site and was hemostatic at end of procedure without any further bleeding on exam.    Pt tolerated the procedure well. All sponge/lap/needle counts were correct  X 2. Pt taken to recovery room in stable condition.     Isidor Marek MD

## 2023-05-01 DIAGNOSIS — Z363 Encounter for antenatal screening for malformations: Secondary | ICD-10-CM | POA: Diagnosis not present

## 2023-05-01 DIAGNOSIS — Z3A19 19 weeks gestation of pregnancy: Secondary | ICD-10-CM | POA: Diagnosis not present

## 2023-05-01 DIAGNOSIS — Z3482 Encounter for supervision of other normal pregnancy, second trimester: Secondary | ICD-10-CM | POA: Diagnosis not present

## 2023-05-31 DIAGNOSIS — D509 Iron deficiency anemia, unspecified: Secondary | ICD-10-CM | POA: Diagnosis not present

## 2023-05-31 DIAGNOSIS — Z3A23 23 weeks gestation of pregnancy: Secondary | ICD-10-CM | POA: Diagnosis not present

## 2023-05-31 DIAGNOSIS — R8290 Unspecified abnormal findings in urine: Secondary | ICD-10-CM | POA: Diagnosis not present

## 2023-05-31 DIAGNOSIS — K219 Gastro-esophageal reflux disease without esophagitis: Secondary | ICD-10-CM | POA: Diagnosis not present

## 2023-06-28 DIAGNOSIS — Z3480 Encounter for supervision of other normal pregnancy, unspecified trimester: Secondary | ICD-10-CM | POA: Diagnosis not present

## 2023-06-28 DIAGNOSIS — Z23 Encounter for immunization: Secondary | ICD-10-CM | POA: Diagnosis not present

## 2023-06-28 DIAGNOSIS — N760 Acute vaginitis: Secondary | ICD-10-CM | POA: Diagnosis not present

## 2023-06-28 DIAGNOSIS — Z3482 Encounter for supervision of other normal pregnancy, second trimester: Secondary | ICD-10-CM | POA: Diagnosis not present

## 2023-06-28 DIAGNOSIS — O26892 Other specified pregnancy related conditions, second trimester: Secondary | ICD-10-CM | POA: Diagnosis not present

## 2023-06-28 DIAGNOSIS — Z3A27 27 weeks gestation of pregnancy: Secondary | ICD-10-CM | POA: Diagnosis not present

## 2023-07-11 ENCOUNTER — Telehealth: Payer: Self-pay | Admitting: Family Medicine

## 2023-07-11 NOTE — Telephone Encounter (Signed)
 error

## 2023-07-12 ENCOUNTER — Encounter: Payer: Self-pay | Admitting: Family Medicine

## 2023-07-12 ENCOUNTER — Ambulatory Visit (INDEPENDENT_AMBULATORY_CARE_PROVIDER_SITE_OTHER): Payer: Self-pay | Admitting: Family Medicine

## 2023-07-12 VITALS — BP 99/67 | HR 106 | Temp 98.6°F | Ht 63.0 in | Wt 160.2 lb

## 2023-07-12 DIAGNOSIS — E538 Deficiency of other specified B group vitamins: Secondary | ICD-10-CM | POA: Diagnosis not present

## 2023-07-12 DIAGNOSIS — R5383 Other fatigue: Secondary | ICD-10-CM | POA: Diagnosis not present

## 2023-07-12 DIAGNOSIS — E559 Vitamin D deficiency, unspecified: Secondary | ICD-10-CM | POA: Diagnosis not present

## 2023-07-12 LAB — COMPREHENSIVE METABOLIC PANEL
ALT: 7 U/L (ref 0–35)
AST: 12 U/L (ref 0–37)
Albumin: 3.4 g/dL — ABNORMAL LOW (ref 3.5–5.2)
Alkaline Phosphatase: 80 U/L (ref 39–117)
BUN: 5 mg/dL — ABNORMAL LOW (ref 6–23)
CO2: 22 meq/L (ref 19–32)
Calcium: 8.7 mg/dL (ref 8.4–10.5)
Chloride: 105 meq/L (ref 96–112)
Creatinine, Ser: 0.33 mg/dL — ABNORMAL LOW (ref 0.40–1.20)
GFR: 145.96 mL/min (ref >60.00)
Glucose, Bld: 84 mg/dL (ref 70–99)
Potassium: 3.5 meq/L (ref 3.5–5.1)
Sodium: 135 meq/L (ref 135–145)
Total Bilirubin: 0.2 mg/dL (ref 0.2–1.2)
Total Protein: 6.7 g/dL (ref 6.0–8.3)

## 2023-07-12 LAB — TSH: TSH: 1.72 u[IU]/mL (ref 0.35–5.50)

## 2023-07-12 LAB — CBC
HCT: 35.9 % — ABNORMAL LOW (ref 36.0–46.0)
Hemoglobin: 11.9 g/dL — ABNORMAL LOW (ref 12.0–15.0)
MCHC: 33.2 g/dL (ref 30.0–36.0)
MCV: 85 fl (ref 78.0–100.0)
Platelets: 229 10*3/uL (ref 150.0–400.0)
RBC: 4.23 Mil/uL (ref 3.87–5.11)
RDW: 13.3 % (ref 11.5–15.5)
WBC: 7 10*3/uL (ref 4.0–10.5)

## 2023-07-12 LAB — VITAMIN B12: Vitamin B-12: 161 pg/mL — ABNORMAL LOW (ref 211–911)

## 2023-07-12 LAB — VITAMIN D 25 HYDROXY (VIT D DEFICIENCY, FRACTURES): VITD: 13.44 ng/mL — ABNORMAL LOW (ref 30.00–100.00)

## 2023-07-12 NOTE — Assessment & Plan Note (Addendum)
 Recheck vitamin D today.  She completed a course of 50,000 IUs weekly.

## 2023-07-12 NOTE — Assessment & Plan Note (Signed)
 She has not had any B12 injections for the last 4 months or so and could not tolerate oral supplementation.  We will recheck labs today including B12, methylmalonic acid, and homocystine.  Assuming her B12 is low we will likely restart weekly injections.  We cannot replete B12 via injections may consider referral to endocrinology.

## 2023-07-12 NOTE — Patient Instructions (Signed)
 It was very nice to see you today!  We will check blood work today including your B12 levels.  We may need to start weekly injections.  Return if symptoms worsen or fail to improve.   Take care, Dr [name]  PLEASE NOTE:  If you had any lab tests, please let us know if you have not heard back within a few days. You may see your results on mychart before we have a chance to review them but we will give you a call once they are reviewed by [place].   If we ordered any referrals today, please let us know if you have not heard from their office within the next week.   If you had any urgent prescriptions sent in today, please check with the pharmacy within an hour of our visit to make sure the prescription was transmitted appropriately.   Please try these tips to maintain a healthy lifestyle:  Eat at least 3 REAL meals and 1-2 snacks per day.  Aim for no more than 5 hours between eating.  If you eat breakfast, please do so within one hour of getting up.   Each meal should contain half fruits/vegetables, one quarter protein, and one quarter carbs (no bigger than a computer mouse)  Cut down on sweet beverages. This includes juice, soda, and sweet tea.   Drink at least 1 glass of water with each meal and aim for at least 8 glasses per day  Exercise at least 150 minutes every week.

## 2023-07-12 NOTE — Progress Notes (Signed)
   Yolanda Valdez is a 23 y.o. female who presents today for an office visit.  Assessment/Plan:  New/Acute Problems: Other fatigue Likely multifactorial though does have known history of B12 deficiency which is likely contributing.  Also her recent pregnancy is probably contributing as well.  Will be checking labs today including CBC, c-Met, TSH, B12, and vitamin D.  Chronic Problems Addressed Today: B12 deficiency She has not had any B12 injections for the last 4 months or so and could not tolerate oral supplementation.  We will recheck labs today including B12, methylmalonic acid, and homocystine.  Assuming her B12 is low we will likely restart weekly injections.  We cannot replete B12 via injections may consider referral to endocrinology.  Vitamin D deficiency Recheck vitamin D today.  She completed a course of 50,000 IUs weekly.     Subjective:  HPI:  See Assessment / plan for status of chronic conditions. She is here today to follow up on fatigue.  We last saw her about 6 months ago.  At that time she was still having ongoing issues with low B12 and fatigue.  We restarted her on the B12 protocol.  She did well with this while she was on weekly injections however once we switched to the monthly injection she did notice that her energy level is decreased.  She was in [place] for a couple of months since her last visit and did not get any B12 injections there.  Additionally since our last visit she has become pregnant and is now about 7 months pregnant.  Still has quite a bit of ongoing issues with fatigue.  She would like to have her B12 level checked.  She did try to take oral replacement however she could not tolerate due to nausea and vomiting.  Her OB/GYN told her to no longer take any supplements.  She is interested in possibly restarting injections.  Her last injection was 4 months ago.        Objective:  Physical Exam: BP 99/67   Pulse (!) 106   Temp 98.6 F (37 C) (Temporal)    Ht 5\' 3"  (1.6 m)   Wt 160 lb 3.2 oz (72.7 kg)   LMP 12/18/2022 (Exact Date)   SpO2 97%   BMI 28.38 kg/m   Gen: No acute distress, resting comfortably Neuro: Grossly normal, moves all extremities Psych: Normal affect and thought content      Oswaldo Cueto M. [name], MD 07/12/2023 2:14 PM

## 2023-07-15 LAB — HOMOCYSTEINE: Homocysteine: 4.7 umol/L (ref <10.4)

## 2023-07-15 LAB — METHYLMALONIC ACID, SERUM: Methylmalonic Acid, Quant: 127 nmol/L (ref 55–335)

## 2023-07-16 ENCOUNTER — Encounter: Payer: Self-pay | Admitting: Family Medicine

## 2023-07-16 NOTE — Progress Notes (Signed)
 Her vitamin D and B12 are both low.  This is probably causing her fatigue.  Recommend starting vitamin D 50,000 IUs once weekly.  Please send in prescription for this.  We should recheck in 3 to 6 months.  For her B12 recommend we start back weekly B12 injections.  We should recheck this again in 3 to 6 months.  The rest of her labs are all at goal and we can recheck in a year.

## 2023-07-17 ENCOUNTER — Other Ambulatory Visit: Payer: Self-pay | Admitting: *Deleted

## 2023-07-17 MED ORDER — VITAMIN D (ERGOCALCIFEROL) 1.25 MG (50000 UNIT) PO CAPS: 50000.0000 [IU] | ORAL_CAPSULE | ORAL | 0 refills | Status: DC

## 2023-07-18 ENCOUNTER — Telehealth: Payer: Self-pay | Admitting: *Deleted

## 2023-07-18 ENCOUNTER — Ambulatory Visit (INDEPENDENT_AMBULATORY_CARE_PROVIDER_SITE_OTHER): Payer: Self-pay | Admitting: *Deleted

## 2023-07-18 DIAGNOSIS — E538 Deficiency of other specified B group vitamins: Secondary | ICD-10-CM

## 2023-07-18 MED ORDER — CYANOCOBALAMIN 1000 MCG/ML IJ SOLN
1000.0000 ug | Freq: Once | INTRAMUSCULAR | Status: AC
Start: 2023-07-18 — End: 2023-07-18
  Administered 2023-07-18: 1000 ug via INTRAMUSCULAR

## 2023-07-18 NOTE — Telephone Encounter (Signed)
 Copied from CRM [id number]. Topic: General - Call Back - No Documentation >> Jul 17, 2023  3:32 PM Alcus Dad wrote: Reason for CRM: Patient called back. Informed patient of lab results and medication that was sent to pharmacy   Patient had a nurse visit today, lab results given to patient  [facility]

## 2023-07-18 NOTE — Progress Notes (Signed)
 Patient presents for B12 injection today. Patient received her B12 injection in Right Deltoid. Patient tolerated injection well.  Documentation entered in [facility] in [employer].

## 2023-07-22 IMAGING — US US MFM OB FOLLOW-UP
1 series · 13 of 28 positions shown · non-contrast
Comparison: none

[Series 1: us mfm ob follow-up · 28 acquisitions, 13 frames shown]
[im 2/28]
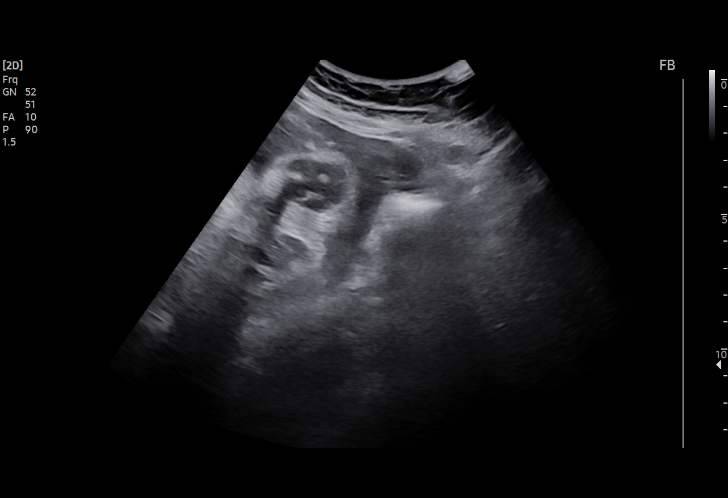
[im 4/28]
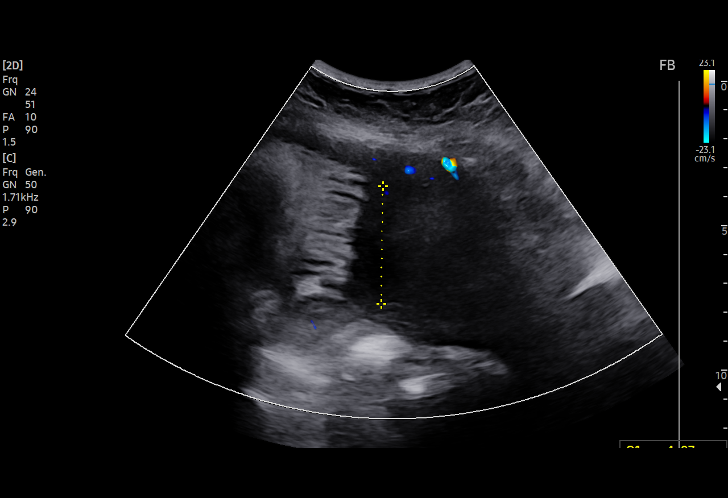
[im 6/28]
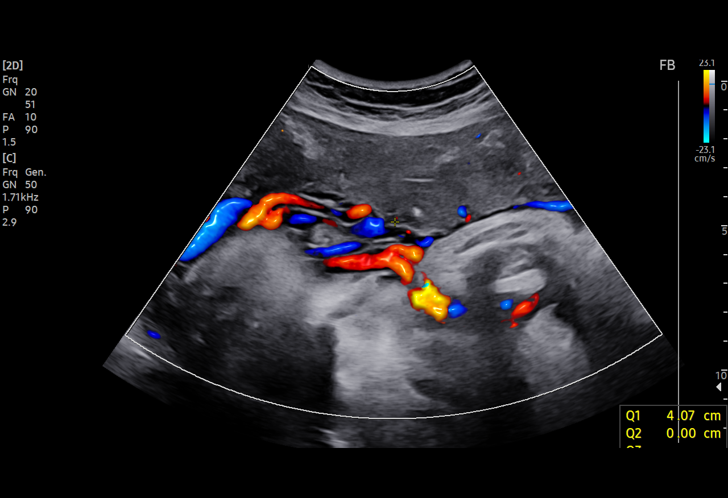
[im 8/28]
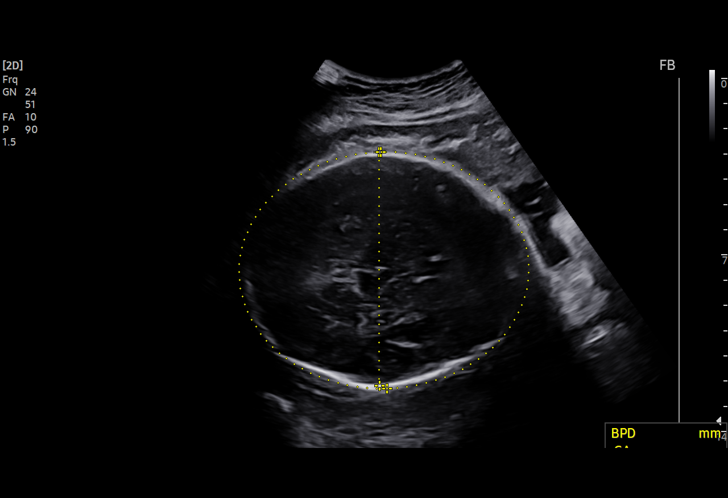
[im 10/28]
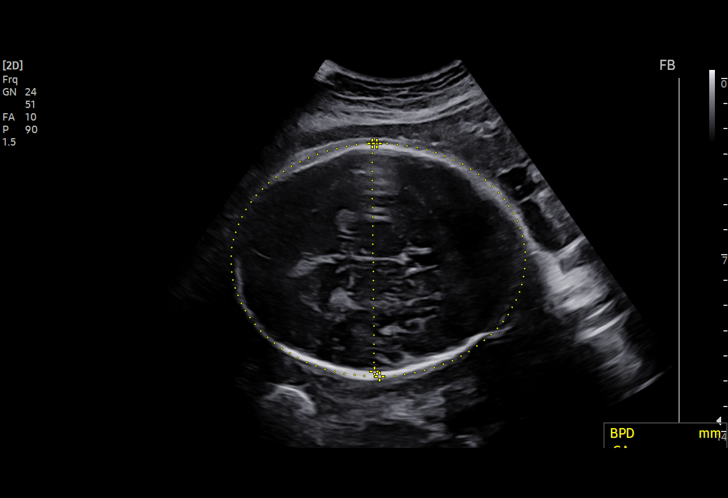
[im 12/28]
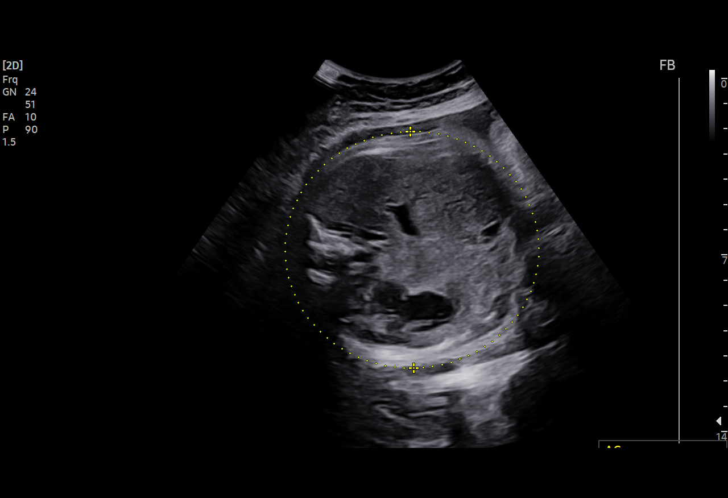
[im 15/28]
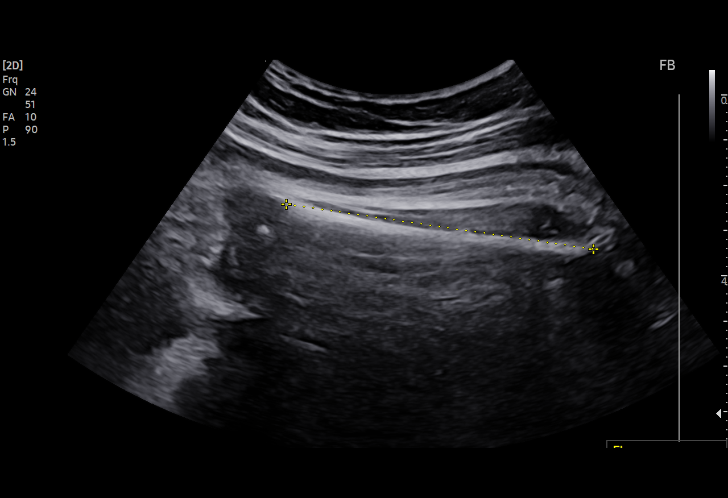
[im 17/28]
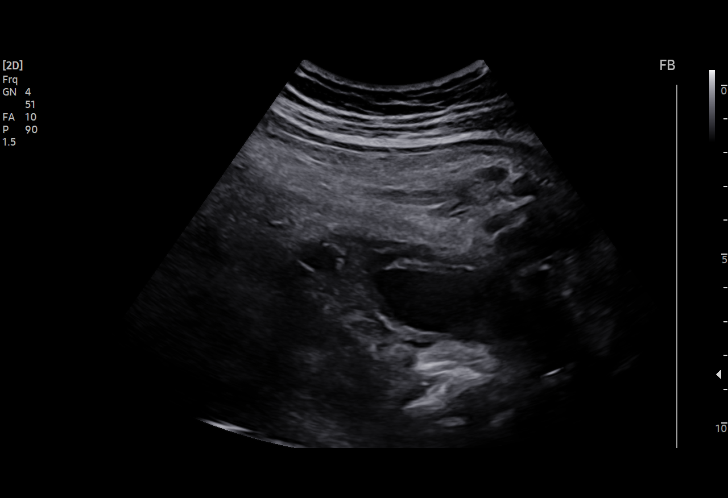
[im 19/28]
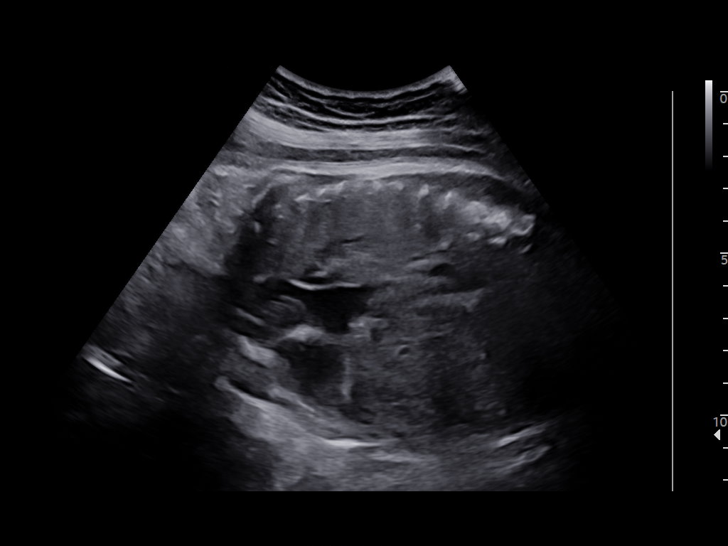
[im 21/28]
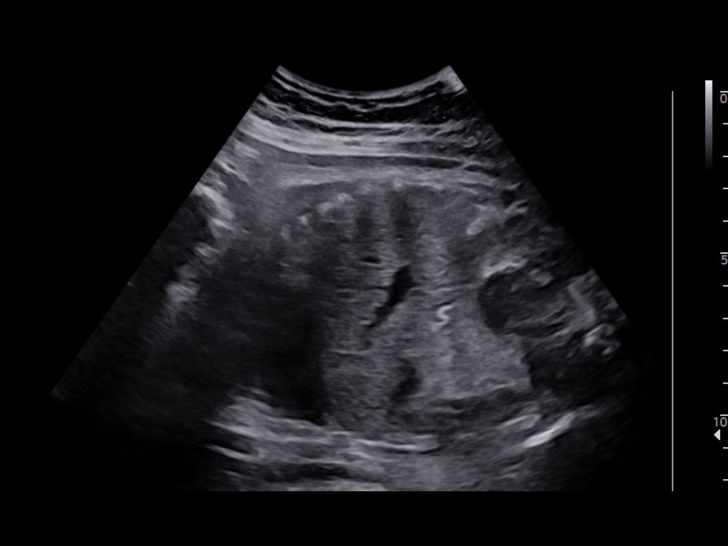
[im 23/28]
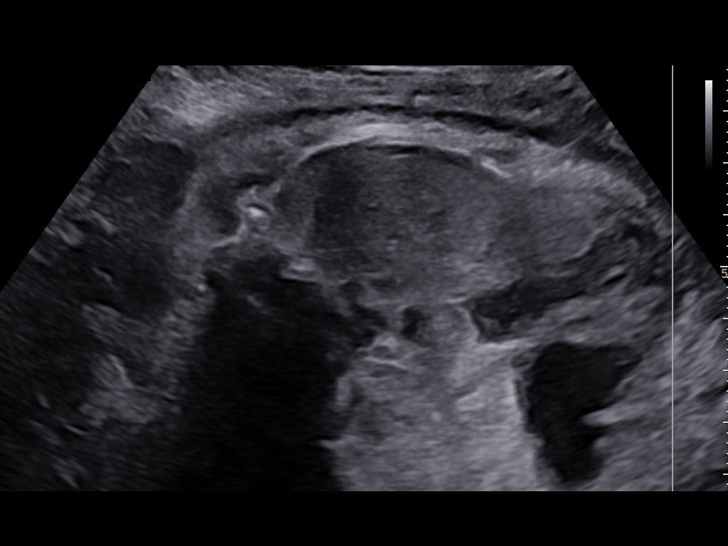
[im 25/28]
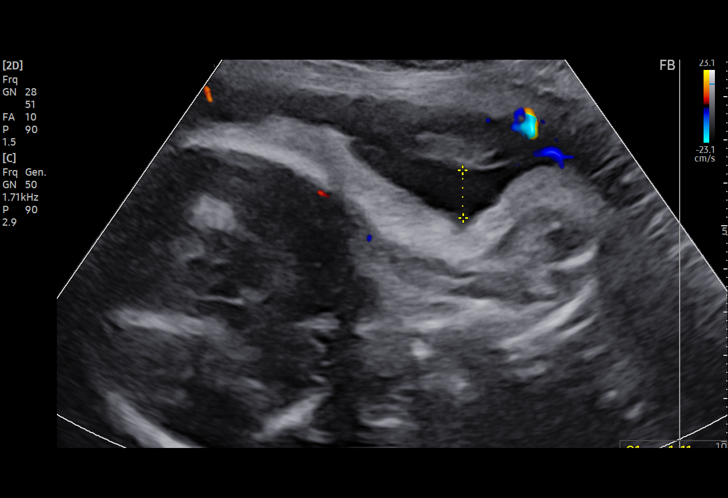
[im 27/28]
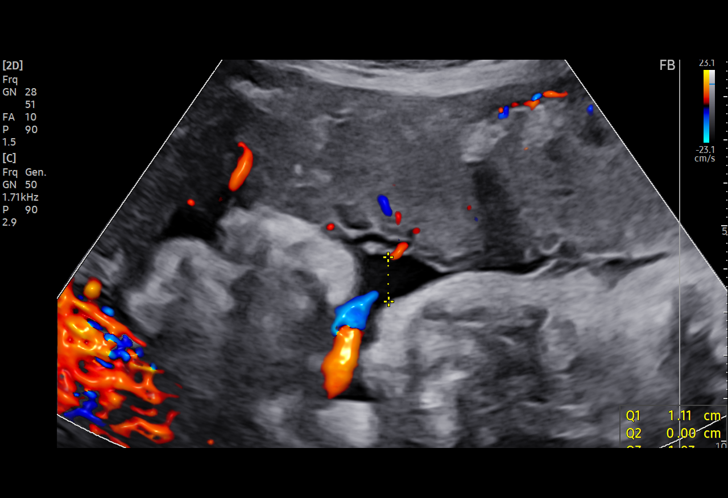

[13 of 28 positions shown; findings below may reference images not displayed]

Indications

 Size-Date Discrepancy
 37 weeks gestation of pregnancy
 Low risk NIPS Negative Horizon
Fetal Evaluation

 Num Of Fetuses:         1
 Fetal Heart Rate(bpm):  151
 Cardiac Activity:       Observed
 Presentation:           Breech
 Placenta:               Anterior
 P. Cord Insertion:      Previously Visualized

 AFI Sum(cm)     %Tile       Largest Pocket(cm)
 2.14            < 3

 RUQ(cm)       RLQ(cm)       LUQ(cm)        LLQ(cm)
 1.11          0             1.03           0
Biometry

 BPD:     91.52  mm     G. Age:  37w 1d         53  %    CI:         77.1   %    70 - 86
                                                         FL/HC:      21.0   %    20.9 -
 HC:    330.05   mm     G. Age:  37w 4d         23  %    HC/AC:      1.07        0.92 -
 AC:    307.95   mm     G. Age:  34w 5d        2.8  %    FL/BPD:     75.6   %    71 - 87
 FL:      69.15  mm     G. Age:  35w 3d          6  %    FL/AC:      22.5   %    20 - 24

 Est. FW:    8794  gm    5 lb 15 oz      10  %
OB History

 Gravidity:    1         Term:   0        Prem:   0        SAB:   0
 TOP:          0       Ectopic:  0        Living: 0
Gestational Age

 LMP:           37w 6d        Date:  01/12/21                  EDD:   10/19/21
 U/S Today:     36w 2d                                        EDD:   10/30/21
 Best:          37w 6d     Det. By:  LMP  (01/12/21)          EDD:   10/19/21
Anatomy

 Cranium:               Appears normal         LVOT:                   Previously seen
 Cavum:                 Appears normal         Aortic Arch:            Previously seen
 Ventricles:            Previously seen        Ductal Arch:            Previously seen
 Choroid Plexus:        Previously seen        Diaphragm:              Previously seen
 Cerebellum:            Previously seen        Stomach:                Appears normal, left
                                                                       sided
 Posterior Fossa:       Previously seen        Abdomen:                Appears normal
 Nuchal Fold:           Previously seen        Abdominal Wall:         Previously seen
 Face:                  Orbits and profile     Cord Vessels:           Previously seen
                        previously seen
 Lips:                  Previously seen        Kidneys:                Previously seen
 Palate:                Previously seen        Bladder:                Appears normal
 Thoracic:              Appears normal         Spine:                  Ltd views no
                                                                       intracranial signs of
                                                                       NTD
 Heart:                 Previously seen        Upper Extremities:      Previously seen
 RVOT:                  Previously seen        Lower Extremities:      Previously seen

 Other:  Fetus appears to be female. Heels and 5th digit previously visualized.
         Technically difficult due to fetal position.
Impression
 MFM Brief Note

 Ms. Maria Dolores is here at 37 w 6 d at the request of Shaonya
 Kobe, Dennise for repeat growth due s<d.

 She is overall doing well but complains of leakage of amniotic
 fluid and decreased fetal movement.,

 Positive interval growth with measurements consistent with
 fetal growth restriction with an EFW 10% and AC 2.8%.
 Good fetal movement and but oligohydramnios.

 I discussed with Ms. Maria Dolores that given oligohydramnios
 and new FGR we recommend delivery.

 The fetus is in a breech position.

 I discussed today's findings with the first attending today, she
 reported that she would also make Dr. Kaderine aware.

 She will go directly to L&D for discussion and planning for
 delivery via ECV with IOL or cesarean delivery.

 All questions answered.

 I spent 20 minutes with > 50% in face to face consultation.

## 2023-07-25 ENCOUNTER — Ambulatory Visit

## 2023-07-25 ENCOUNTER — Ambulatory Visit (INDEPENDENT_AMBULATORY_CARE_PROVIDER_SITE_OTHER)

## 2023-07-25 DIAGNOSIS — E538 Deficiency of other specified B group vitamins: Secondary | ICD-10-CM | POA: Diagnosis not present

## 2023-07-25 MED ORDER — CYANOCOBALAMIN 1000 MCG/ML IJ SOLN
1000.0000 ug | Freq: Once | INTRAMUSCULAR | Status: AC
Start: 2023-07-25 — End: 2023-07-25
  Administered 2023-07-25: 1000 ug via INTRAMUSCULAR

## 2023-07-25 NOTE — Progress Notes (Signed)
 Patient is in office today for a nurse visit for B12 Injection, per PCP's order. Patient Injection was given in the  Left deltoid. Patient tolerated injection well.

## 2023-08-01 ENCOUNTER — Ambulatory Visit (INDEPENDENT_AMBULATORY_CARE_PROVIDER_SITE_OTHER)

## 2023-08-01 DIAGNOSIS — E538 Deficiency of other specified B group vitamins: Secondary | ICD-10-CM

## 2023-08-01 MED ORDER — CYANOCOBALAMIN 1000 MCG/ML IJ SOLN
1000.0000 ug | Freq: Once | INTRAMUSCULAR | Status: AC
Start: 2023-08-01 — End: 2023-08-01
  Administered 2023-08-01: 1000 ug via INTRAMUSCULAR

## 2023-08-01 NOTE — Progress Notes (Signed)
 Patient is in office today for a nurse visit for B12 Injection. Patient Injection was given in the  Right deltoid. Patient tolerated injection well.

## 2023-08-08 ENCOUNTER — Ambulatory Visit

## 2023-08-09 ENCOUNTER — Ambulatory Visit

## 2023-08-09 DIAGNOSIS — N760 Acute vaginitis: Secondary | ICD-10-CM | POA: Diagnosis not present

## 2023-08-09 DIAGNOSIS — Z3A33 33 weeks gestation of pregnancy: Secondary | ICD-10-CM | POA: Diagnosis not present

## 2023-08-13 ENCOUNTER — Ambulatory Visit

## 2023-08-16 ENCOUNTER — Ambulatory Visit (INDEPENDENT_AMBULATORY_CARE_PROVIDER_SITE_OTHER): Admitting: *Deleted

## 2023-08-16 ENCOUNTER — Ambulatory Visit

## 2023-08-16 DIAGNOSIS — E538 Deficiency of other specified B group vitamins: Secondary | ICD-10-CM | POA: Diagnosis not present

## 2023-08-16 MED ORDER — CYANOCOBALAMIN 1000 MCG/ML IJ SOLN
1000.0000 ug | Freq: Once | INTRAMUSCULAR | Status: AC
Start: 2023-08-16 — End: 2023-08-16
  Administered 2023-08-16: 1000 ug via INTRAMUSCULAR

## 2023-08-16 NOTE — Progress Notes (Signed)
Per orders of Dr. Parker, injection of B 12  given in left deltoid per patient preference by Anner Baity S Duke Weisensel, CMA. Patient tolerated injection well. Patient reminded to schedule next injection.  

## 2023-08-31 DIAGNOSIS — Z369 Encounter for antenatal screening, unspecified: Secondary | ICD-10-CM | POA: Diagnosis not present

## 2023-08-31 DIAGNOSIS — O09293 Supervision of pregnancy with other poor reproductive or obstetric history, third trimester: Secondary | ICD-10-CM | POA: Diagnosis not present

## 2023-08-31 DIAGNOSIS — Z113 Encounter for screening for infections with a predominantly sexual mode of transmission: Secondary | ICD-10-CM | POA: Diagnosis not present

## 2023-08-31 DIAGNOSIS — Z3A36 36 weeks gestation of pregnancy: Secondary | ICD-10-CM | POA: Diagnosis not present

## 2023-08-31 LAB — OB RESULTS CONSOLE GBS: GBS: NEGATIVE

## 2023-09-10 ENCOUNTER — Encounter: Payer: 59 | Admitting: Family Medicine

## 2023-09-11 ENCOUNTER — Encounter (HOSPITAL_COMMUNITY): Payer: Self-pay

## 2023-09-11 ENCOUNTER — Encounter (HOSPITAL_COMMUNITY): Payer: Self-pay | Admitting: *Deleted

## 2023-09-11 NOTE — Patient Instructions (Signed)
 Yolanda Valdez  09/11/2023   Your procedure is scheduled on:  09/18/2023  Arrive at 0800 at Entrance C on [address] at [facility]  and [address]. You are invited to use the FREE valet parking or use the Visitor's parking deck.  Pick up the phone at the desk and dial [phone].  Call this number if you have problems the morning of surgery: [phone]  Remember:   Do not eat food:(After Midnight) Desps de medianoche.  You may drink clear liquids until arrival at __0600___.  Clear liquids means a liquid you can see thru.  It can have color such as Cola or Kool aid.  Tea is OK and coffee as long as no milk or creamer of any kind.  Take these medicines the morning of surgery with A SIP OF WATER :  none   Do not wear jewelry, make-up or nail polish.  Do not wear lotions, powders, or perfumes. Do not wear deodorant.  Do not shave 48 hours prior to surgery.  Do not bring valuables to the hospital.  [facility] is not   responsible for any belongings or valuables brought to the hospital.  Contacts, dentures or bridgework may not be worn into surgery.  Leave suitcase in the car. After surgery it may be brought to your room.  For patients admitted to the hospital, checkout time is 11:00 AM the day of              discharge.      Please read over the following fact sheets that you were given:     Preparing for Surgery

## 2023-09-13 ENCOUNTER — Ambulatory Visit

## 2023-09-13 NOTE — H&P (Signed)
 Yolanda Valdez is a 23 y.o. female presenting for scheduled RCS. Prior CS for breech. RR girl.   OB History     Gravida  2   Para  1   Term  1   Preterm  0   AB  0   Living  1      SAB  0   IAB  0   Ectopic  0   Multiple  0   Live Births  1          Past Medical History:  Diagnosis Date   Low serum vitamin B12    Medical history non-contributory    Past Surgical History:  Procedure Laterality Date   CESAREAN SECTION N/A 10/04/2021   Procedure: CESAREAN SECTION;  Surgeon: Tresia Fruit, MD;  Location: [facility] LD ORS;  Service: Obstetrics;  Laterality: N/A;   NO PAST SURGERIES     Family History: family history includes Diabetes in her mother and paternal grandfather; Diabetes type II in her mother; Hypertension in her mother and paternal grandfather. Social History:  reports that she has never smoked. She has never used smokeless tobacco. She reports that she does not drink alcohol and does not use drugs.     Maternal Diabetes: No Genetic Screening: Normal Maternal Ultrasounds/Referrals: Normal Fetal Ultrasounds or other Referrals:  None Maternal Substance Abuse:  No Significant Maternal Medications:  None Significant Maternal Lab Results:  Group B Strep negative Number of Prenatal Visits:greater than 3 verified prenatal visits Other Comments:  None  Review of Systems History   Height 5\' 3"  (1.6 m), weight 75.8 kg, last menstrual period 12/18/2022, not currently breastfeeding. Exam Physical Exam  (from office) NAD, A&O NWOB Abd soft, nondistended, gravid  Prenatal labs: ABO, Rh:   Antibody: Negative (12/02 0000) Rubella: Immune (12/02 0000) RPR: Nonreactive (12/02 0000)  HBsAg: Negative (12/02 0000)  HIV: Non-reactive (12/02 0000)  GBS: Negative/-- (05/09 0000)   Assessment/Plan:  23 yo G2P1 presenting for scheduled RCS. Risks discussed including infection, bleeding, damage to surrounding structures, the need for additional procedures including  hysterectomy, and the possibility of uterine rupture with neonatal morbidity/mortality, scarring, and abnormal placentation with subsequent pregnancies. Patient agrees to proceed. 2 g ancef  and TXA on call to OR.     Yolanda Valdez 09/13/2023, 10:17 AM

## 2023-09-15 ENCOUNTER — Encounter (HOSPITAL_COMMUNITY): Payer: Self-pay | Admitting: Obstetrics and Gynecology

## 2023-09-15 ENCOUNTER — Inpatient Hospital Stay (HOSPITAL_COMMUNITY)
Admission: AD | Admit: 2023-09-15 | Discharge: 2023-09-16 | Disposition: A | Discharge status: Home / Self Care | Source: Home / Self Care | Attending: Obstetrics and Gynecology | Admitting: Obstetrics and Gynecology

## 2023-09-15 DIAGNOSIS — Z3A38 38 weeks gestation of pregnancy: Secondary | ICD-10-CM

## 2023-09-15 DIAGNOSIS — O26893 Other specified pregnancy related conditions, third trimester: Secondary | ICD-10-CM | POA: Diagnosis not present

## 2023-09-15 DIAGNOSIS — M549 Dorsalgia, unspecified: Secondary | ICD-10-CM | POA: Insufficient documentation

## 2023-09-15 DIAGNOSIS — O471 False labor at or after 37 completed weeks of gestation: Secondary | ICD-10-CM | POA: Diagnosis not present

## 2023-09-15 DIAGNOSIS — O4292 Full-term premature rupture of membranes, unspecified as to length of time between rupture and onset of labor: Secondary | ICD-10-CM | POA: Diagnosis not present

## 2023-09-15 DIAGNOSIS — Z3689 Encounter for other specified antenatal screening: Secondary | ICD-10-CM

## 2023-09-15 DIAGNOSIS — O99891 Other specified diseases and conditions complicating pregnancy: Secondary | ICD-10-CM | POA: Insufficient documentation

## 2023-09-15 DIAGNOSIS — N939 Abnormal uterine and vaginal bleeding, unspecified: Secondary | ICD-10-CM | POA: Diagnosis not present

## 2023-09-15 LAB — URINALYSIS, ROUTINE W REFLEX MICROSCOPIC
Bilirubin Urine: NEGATIVE
Glucose, UA: NEGATIVE mg/dL
Hgb urine dipstick: NEGATIVE
Ketones, ur: NEGATIVE mg/dL
Leukocytes,Ua: NEGATIVE
Nitrite: NEGATIVE
Protein, ur: NEGATIVE mg/dL
Specific Gravity, Urine: 1.002 — ABNORMAL LOW (ref 1.005–1.030)
pH: 7 (ref 5.0–8.0)

## 2023-09-15 MED ORDER — ACETAMINOPHEN 325 MG PO TABS
650.0000 mg | ORAL_TABLET | Freq: Once | ORAL | Status: AC
  Administered 2023-09-16: 650 mg via ORAL
  Filled 2023-09-15: qty 2

## 2023-09-15 MED ORDER — CYCLOBENZAPRINE HCL 5 MG PO TABS
10.0000 mg | ORAL_TABLET | Freq: Once | ORAL | Status: AC
  Administered 2023-09-16: 10 mg via ORAL
  Filled 2023-09-15: qty 2

## 2023-09-15 NOTE — MAU Note (Signed)
..  Yolanda Valdez is a 23 y.o. at [redacted]w[redacted]d here in MAU reporting: contractions which started this morning but have worsened in the last few hours. Denies leaking fluid, discharge or vaginal bleeding. Endorses +FM.   Reports intercourse within last 24 hours. Reports 2 occurrences of diarrhea today which is not a change ("has been having loose stool throughout pregnancy").   Hx of c/s for low AFI x1 with planned repeat c-section scheduled for Tuesday 5/27.  Pain score: 8/10 Vitals:   09/15/23 2156  BP: 118/76  Pulse: 82  Resp: 16  Temp: 98 F (36.7 C)  SpO2: 100%     FHT:150 Lab orders placed from triage:  UA, labor eval

## 2023-09-15 NOTE — MAU Provider Note (Signed)
 Event Date/Time   First Provider Initiated Contact with Patient 09/15/23 2358       S: Ms. Yolanda Valdez is a 23 y.o. G2P1001 at [redacted]w[redacted]d  who presents to MAU today complaining contractions q 5-6 minutes. She denies vaginal bleeding. She denies LOF. She reports normal fetal movement.  She is scheduled for repeat C/S on Tuesday.  She receives her care at [facility]. After labor evaluation, patient with continued intermittent back pain and requests pain medication.   O: BP 118/76   Pulse 82   Temp 98 F (36.7 C)   Resp 16   Ht 5\' 3"  (1.6 m)   Wt 75.8 kg   LMP 12/18/2022 (Exact Date)   SpO2 100%   BMI 29.58 kg/m  GENERAL: Well-developed, well-nourished female in no acute distress.  HEAD: Normocephalic, atraumatic.  CHEST: Normal effort of breathing, regular heart rate ABDOMEN: Soft, nontender, gravid  Cervical exam:  Dilation: 2 Effacement (%): 50 Station: -2 Presentation: Vertex Exam by:: Mickeal Aland, RN   Fetal Monitoring: Baseline: 150 Variability: Moderate Accelerations: Present 15x15 Decelerations: None Contractions: Irritability   A: SIUP at [redacted]w[redacted]d  False labor Back pain Cat I FT  P: -Labor precautions reviewed.  -Patient requesting pain medication for back pain.  -Reviewed usage of flexeril  and tylenol . -Give initial dose now and will send limited script to pharmacy. -Discharge to home.  -Return as scheduled for repeat c/s or earlier if labor occurs.   Loetta Ringer, CNM 09/15/2023 11:59 PM

## 2023-09-16 ENCOUNTER — Other Ambulatory Visit: Payer: Self-pay

## 2023-09-16 ENCOUNTER — Inpatient Hospital Stay (HOSPITAL_COMMUNITY)
Admission: AD | Admit: 2023-09-16 | Discharge: 2023-09-16 | Disposition: A | Discharge status: Home / Self Care | Attending: Obstetrics and Gynecology | Admitting: Obstetrics and Gynecology

## 2023-09-16 ENCOUNTER — Encounter (HOSPITAL_COMMUNITY): Payer: Self-pay | Admitting: Obstetrics and Gynecology

## 2023-09-16 DIAGNOSIS — O26853 Spotting complicating pregnancy, third trimester: Secondary | ICD-10-CM | POA: Insufficient documentation

## 2023-09-16 DIAGNOSIS — O471 False labor at or after 37 completed weeks of gestation: Secondary | ICD-10-CM | POA: Diagnosis not present

## 2023-09-16 DIAGNOSIS — N939 Abnormal uterine and vaginal bleeding, unspecified: Secondary | ICD-10-CM

## 2023-09-16 DIAGNOSIS — Z3A38 38 weeks gestation of pregnancy: Secondary | ICD-10-CM

## 2023-09-16 MED ORDER — CYCLOBENZAPRINE HCL 10 MG PO TABS: 10.0000 mg | ORAL_TABLET | Freq: Three times a day (TID) | ORAL | 0 refills | Status: DC | PRN

## 2023-09-16 NOTE — MAU Provider Note (Signed)
 Chief Complaint:  Contractions and Vaginal Bleeding   HPI   Event Date/Time   First Provider Initiated Contact with Patient 09/16/23 1212      Yolanda Valdez is a 23 y.o. G2P1001 at [redacted]w[redacted]d who presents to maternity admissions reporting some vaginal spotting this morning. Patient reports she was seen yesterday for a labor evaluation and was sent home. This morning after using the BR and wiping she noticed some blood and was concerned. Denies any active VB, LOF, and reports irregular ctx's. She is scheduled for a repeat C- Section on 09/18/23  Pregnancy Course: [facility]  Past Medical History:  Diagnosis Date   Low serum vitamin B12    Medical history non-contributory    OB History  Gravida Para Term Preterm AB Living  2 1 1  0 0 1  SAB IAB Ectopic Multiple Live Births  0 0 0 0 1    # Outcome Date GA Lbr Len/2nd Weight Sex Type Anes PTL Lv  2 Current           1 Term 10/04/21 [redacted]w[redacted]d  2680 g F CS-LTranv Spinal  LIV   Past Surgical History:  Procedure Laterality Date   CESAREAN SECTION N/A 10/04/2021   Procedure: CESAREAN SECTION;  Surgeon: Tresia Fruit, MD;  Location: [facility] LD ORS;  Service: Obstetrics;  Laterality: N/A;   NO PAST SURGERIES     Family History  Problem Relation Age of Onset   Hypertension Mother    Diabetes type II Mother    Diabetes Mother    Hypertension Paternal Grandfather    Diabetes Paternal Grandfather    Social History   Tobacco Use   Smoking status: Never   Smokeless tobacco: Never  Vaping Use   Vaping status: Never Used  Substance Use Topics   Alcohol use: Never   Drug use: Never   Allergies  Allergen Reactions   Pork-Derived Products    No medications prior to admission.    I have reviewed patient's Past Medical Hx, Surgical Hx, Family Hx, Social Hx, medications and allergies.   ROS  Pertinent items noted in HPI and remainder of comprehensive ROS otherwise negative.   PHYSICAL EXAM   Patient Vitals for the past 24 hrs:   BP Temp Pulse Resp SpO2  09/16/23 1213 108/73 -- (!) 115 -- --  09/16/23 1135 112/70 -- (!) 102 -- --  09/16/23 1123 119/72 97.8 F (36.6 C) (!) 113 16 100 %    Constitutional: Well-developed, well-nourished female in no acute distress.  Cardiovascular: normal rate & rhythm, warm and well-perfused Respiratory: normal effort, no problems with respiration noted GI: Abd soft, non-tender, gravid MS: Extremities nontender, no edema, normal ROM Neurologic: Alert and oriented x 4.  GU: no CVA tenderness Pelvic: Exam chaperoned by Wilhemenia Hard' Donnel RN SSE: No Pooling, no evidence of vaginal bleeding in the vault, cervix visually closed  Dilation: 2 Exam by:: L. Kambry Takacs, NP  Fetal Tracing: Cat 1 reactive Baseline: 150-155 Variability: moderate Accelerations: present Decelerations: absent Toco: no ctx's   Labs: Results for orders placed or performed during the hospital encounter of 09/15/23 (from the past 24 hours)  Urinalysis, Routine w reflex microscopic -Urine, Clean Catch     Status: Abnormal   Collection Time: 09/15/23 10:12 PM  Result Value Ref Range   Color, Urine STRAW (A) YELLOW   APPearance CLEAR CLEAR   Specific Gravity, Urine 1.002 (L) 1.005 - 1.030   pH 7.0 5.0 - 8.0   Glucose, UA  NEGATIVE NEGATIVE mg/dL   Hgb urine dipstick NEGATIVE NEGATIVE   Bilirubin Urine NEGATIVE NEGATIVE   Ketones, ur NEGATIVE NEGATIVE mg/dL   Protein, ur NEGATIVE NEGATIVE mg/dL   Nitrite NEGATIVE NEGATIVE   Leukocytes,Ua NEGATIVE NEGATIVE    Imaging:  No results found.  MDM & MAU COURSE  MDM:  High   R/O active labor ( No evidence of active labor at this time- No Cervical change noted ) Normal speculum exam with no evidence of vaginal bleeding or ROM NST for GA U/A: Negative   MAU Course: Orders Placed This Encounter  Procedures   Discharge patient Discharge disposition: 01-Home or Self Care; Discharge patient date: 09/16/2023   Discharge patient Discharge disposition: 01-Home or  Self Care; Discharge patient date: 09/16/2023    I have reviewed the patient chart and performed the physical exam . I have ordered & interpreted the lab results and reviewed and interpreted the NST Medications ordered as stated below.  A/P as described below.  Counseling and education provided and patient agreeable  with plan as described below. Verbalized understanding.    ASSESSMENT   1. False labor after 37 completed weeks of gestation   2. [redacted] weeks gestation of pregnancy   3. Vaginal spotting     PLAN  Discharge home in stable condition with return precautions.   F/U as scheduled with Primary OB  See AVS for full description of information given to the patient including both verbal and written. Patient verbalized understanding and agrees with the plan as described above.     Allergies as of 09/16/2023       Reactions   Pork-derived Products         Medication List     TAKE these medications    cyclobenzaprine 10 MG tablet Commonly known as: FLEXERIL Take 1 tablet (10 mg total) by mouth 3 (three) times daily as needed for muscle spasms.        Debbe Fail, MSN, [facility], [facility] [employer]

## 2023-09-16 NOTE — MAU Note (Addendum)
.  Yolanda Valdez is a 23 y.o. at [redacted]w[redacted]d here in MAU reporting:  She was here last night was 2cm and was discharge. She had ctx's all night and was wiping this moring and saw red blood on the tissue. Patient reports ctx's in her back. Onset of complaint:  yesterday Patient is unsure if water  broke Pain score: 7/10 There were no vitals filed for this visit.    Lab orders placed from triage:   none

## 2023-09-17 ENCOUNTER — Encounter (HOSPITAL_COMMUNITY)
Admission: AD | Disposition: A | Discharge status: Home / Self Care | Payer: Self-pay | Source: Home / Self Care | Attending: Obstetrics and Gynecology

## 2023-09-17 ENCOUNTER — Inpatient Hospital Stay (HOSPITAL_COMMUNITY): Admitting: Anesthesiology

## 2023-09-17 ENCOUNTER — Other Ambulatory Visit: Payer: Self-pay

## 2023-09-17 ENCOUNTER — Inpatient Hospital Stay (HOSPITAL_COMMUNITY): Admit: 2023-09-17 | Payer: Self-pay | Admitting: Obstetrics and Gynecology

## 2023-09-17 ENCOUNTER — Encounter (HOSPITAL_COMMUNITY): Payer: Self-pay | Admitting: Obstetrics and Gynecology

## 2023-09-17 ENCOUNTER — Inpatient Hospital Stay (HOSPITAL_COMMUNITY)
Admission: AD | Admit: 2023-09-17 | Discharge: 2023-09-19 | DRG: 787 | Disposition: A | Discharge status: Home / Self Care | Attending: Obstetrics and Gynecology | Admitting: Obstetrics and Gynecology

## 2023-09-17 DIAGNOSIS — O4292 Full-term premature rupture of membranes, unspecified as to length of time between rupture and onset of labor: Secondary | ICD-10-CM | POA: Diagnosis not present

## 2023-09-17 DIAGNOSIS — Z8249 Family history of ischemic heart disease and other diseases of the circulatory system: Secondary | ICD-10-CM | POA: Diagnosis not present

## 2023-09-17 DIAGNOSIS — O34211 Maternal care for low transverse scar from previous cesarean delivery: Secondary | ICD-10-CM

## 2023-09-17 DIAGNOSIS — Z3490 Encounter for supervision of normal pregnancy, unspecified, unspecified trimester: Secondary | ICD-10-CM

## 2023-09-17 DIAGNOSIS — Z23 Encounter for immunization: Secondary | ICD-10-CM | POA: Diagnosis not present

## 2023-09-17 DIAGNOSIS — Z3A39 39 weeks gestation of pregnancy: Secondary | ICD-10-CM

## 2023-09-17 DIAGNOSIS — D62 Acute posthemorrhagic anemia: Secondary | ICD-10-CM | POA: Diagnosis not present

## 2023-09-17 DIAGNOSIS — Z833 Family history of diabetes mellitus: Secondary | ICD-10-CM | POA: Diagnosis not present

## 2023-09-17 DIAGNOSIS — Z98891 History of uterine scar from previous surgery: Principal | ICD-10-CM

## 2023-09-17 DIAGNOSIS — O26893 Other specified pregnancy related conditions, third trimester: Secondary | ICD-10-CM | POA: Diagnosis not present

## 2023-09-17 DIAGNOSIS — O9081 Anemia of the puerperium: Secondary | ICD-10-CM | POA: Diagnosis not present

## 2023-09-17 LAB — CBC
HCT: 33.3 % — ABNORMAL LOW (ref 36.0–46.0)
Hemoglobin: 10.5 g/dL — ABNORMAL LOW (ref 12.0–15.0)
MCH: 24.6 pg — ABNORMAL LOW (ref 26.0–34.0)
MCHC: 31.5 g/dL (ref 30.0–36.0)
MCV: 78 fL — ABNORMAL LOW (ref 80.0–100.0)
Platelets: 223 10*3/uL (ref 150–400)
RBC: 4.27 MIL/uL (ref 3.87–5.11)
RDW: 14.6 % (ref 11.5–15.5)
WBC: 8.5 10*3/uL (ref 4.0–10.5)
nRBC: 0 % (ref 0.0–0.2)

## 2023-09-17 LAB — HIV ANTIBODY (ROUTINE TESTING W REFLEX): HIV Screen 4th Generation wRfx: NONREACTIVE

## 2023-09-17 LAB — TYPE AND SCREEN
ABO/RH(D): B POS
Antibody Screen: NEGATIVE

## 2023-09-17 LAB — RPR: RPR Ser Ql: NONREACTIVE

## 2023-09-17 LAB — POCT FERN TEST: POCT Fern Test: POSITIVE

## 2023-09-17 SURGERY — Surgical Case
Anesthesia: Spinal

## 2023-09-17 MED ORDER — NALOXONE HCL 4 MG/10ML IJ SOLN: 1.0000 ug/kg/h | INTRAVENOUS | Status: DC | PRN

## 2023-09-17 MED ORDER — EPHEDRINE SULFATE (PRESSORS) 50 MG/ML IJ SOLN
INTRAMUSCULAR | Status: DC | PRN
  Administered 2023-09-17 (×3): 5 mg via INTRAVENOUS

## 2023-09-17 MED ORDER — OXYCODONE HCL 5 MG PO TABS
5.0000 mg | ORAL_TABLET | ORAL | Status: DC | PRN
  Administered 2023-09-17: 5 mg via ORAL
  Administered 2023-09-18 – 2023-09-19 (×4): 10 mg via ORAL
  Filled 2023-09-17 (×2): qty 2
  Filled 2023-09-17: qty 1
  Filled 2023-09-17 (×2): qty 2

## 2023-09-17 MED ORDER — DIPHENHYDRAMINE HCL 25 MG PO CAPS
25.0000 mg | ORAL_CAPSULE | ORAL | Status: DC | PRN
  Administered 2023-09-17: 25 mg via ORAL
  Filled 2023-09-17: qty 1

## 2023-09-17 MED ORDER — SCOPOLAMINE 1 MG/3DAYS TD PT72
1.0000 | MEDICATED_PATCH | Freq: Once | TRANSDERMAL | Status: DC
  Administered 2023-09-17: 1.5 mg via TRANSDERMAL

## 2023-09-17 MED ORDER — SODIUM CHLORIDE 0.9 % IR SOLN
Status: DC | PRN
  Administered 2023-09-17: 1000 mL

## 2023-09-17 MED ORDER — PRENATAL MULTIVITAMIN CH
1.0000 | ORAL_TABLET | Freq: Every day | ORAL | Status: DC
  Filled 2023-09-17 (×3): qty 1

## 2023-09-17 MED ORDER — DEXAMETHASONE SODIUM PHOSPHATE 4 MG/ML IJ SOLN
INTRAMUSCULAR | Status: DC | PRN
  Administered 2023-09-17: 8 mg via INTRAVENOUS

## 2023-09-17 MED ORDER — COCONUT OIL OIL: 1.0000 | TOPICAL_OIL | Status: DC | PRN

## 2023-09-17 MED ORDER — HYDROMORPHONE HCL 1 MG/ML IJ SOLN
INTRAMUSCULAR | Status: AC
  Filled 2023-09-17: qty 0.5

## 2023-09-17 MED ORDER — SODIUM CHLORIDE 0.9 % IV SOLN: INTRAVENOUS | Status: DC | PRN

## 2023-09-17 MED ORDER — SIMETHICONE 80 MG PO CHEW
80.0000 mg | CHEWABLE_TABLET | Freq: Three times a day (TID) | ORAL | Status: DC
  Administered 2023-09-17 – 2023-09-19 (×6): 80 mg via ORAL
  Filled 2023-09-17 (×6): qty 1

## 2023-09-17 MED ORDER — ONDANSETRON HCL 4 MG/2ML IJ SOLN
INTRAMUSCULAR | Status: DC | PRN
  Administered 2023-09-17: 4 mg via INTRAVENOUS

## 2023-09-17 MED ORDER — IBUPROFEN 600 MG PO TABS
600.0000 mg | ORAL_TABLET | Freq: Four times a day (QID) | ORAL | Status: DC
  Administered 2023-09-17 – 2023-09-19 (×9): 600 mg via ORAL
  Filled 2023-09-17 (×9): qty 1

## 2023-09-17 MED ORDER — AZITHROMYCIN 500 MG IV SOLR
500.0000 mg | INTRAVENOUS | Status: AC
  Administered 2023-09-17: 500 mg via INTRAVENOUS
  Filled 2023-09-17: qty 5

## 2023-09-17 MED ORDER — PHENYLEPHRINE HCL-NACL 20-0.9 MG/250ML-% IV SOLN
INTRAVENOUS | Status: DC | PRN
  Administered 2023-09-17: 60 ug/min via INTRAVENOUS

## 2023-09-17 MED ORDER — CEFAZOLIN SODIUM-DEXTROSE 2-4 GM/100ML-% IV SOLN
2.0000 g | INTRAVENOUS | Status: AC
  Administered 2023-09-17: 2 g via INTRAVENOUS
  Filled 2023-09-17: qty 100

## 2023-09-17 MED ORDER — WITCH HAZEL-GLYCERIN EX PADS: 1.0000 | MEDICATED_PAD | CUTANEOUS | Status: DC | PRN

## 2023-09-17 MED ORDER — MORPHINE SULFATE (PF) 0.5 MG/ML IJ SOLN
INTRAMUSCULAR | Status: AC
  Filled 2023-09-17: qty 10

## 2023-09-17 MED ORDER — SENNOSIDES-DOCUSATE SODIUM 8.6-50 MG PO TABS
2.0000 | ORAL_TABLET | Freq: Every day | ORAL | Status: DC
  Administered 2023-09-18 – 2023-09-19 (×2): 2 via ORAL
  Filled 2023-09-17 (×2): qty 2

## 2023-09-17 MED ORDER — ACETAMINOPHEN 325 MG PO TABS
650.0000 mg | ORAL_TABLET | ORAL | Status: DC | PRN
  Administered 2023-09-17: 650 mg via ORAL
  Filled 2023-09-17: qty 2

## 2023-09-17 MED ORDER — SOD CITRATE-CITRIC ACID 500-334 MG/5ML PO SOLN: 30.0000 mL | ORAL | Status: DC

## 2023-09-17 MED ORDER — SODIUM CHLORIDE 0.9% FLUSH: 3.0000 mL | INTRAVENOUS | Status: DC | PRN

## 2023-09-17 MED ORDER — PHENYLEPHRINE HCL-NACL 20-0.9 MG/250ML-% IV SOLN
INTRAVENOUS | Status: AC
Start: 2023-09-17 — End: ?
  Filled 2023-09-17: qty 250

## 2023-09-17 MED ORDER — BUPIVACAINE IN DEXTROSE 0.75-8.25 % IT SOLN
INTRATHECAL | Status: DC | PRN
  Administered 2023-09-17: 1.5 mL via INTRATHECAL

## 2023-09-17 MED ORDER — PHENYLEPHRINE 80 MCG/ML (10ML) SYRINGE FOR IV PUSH (FOR BLOOD PRESSURE SUPPORT)
PREFILLED_SYRINGE | INTRAVENOUS | Status: AC
  Filled 2023-09-17: qty 10

## 2023-09-17 MED ORDER — FENTANYL CITRATE (PF) 100 MCG/2ML IJ SOLN
INTRAMUSCULAR | Status: AC
  Filled 2023-09-17: qty 2

## 2023-09-17 MED ORDER — FENTANYL CITRATE (PF) 100 MCG/2ML IJ SOLN
INTRAMUSCULAR | Status: DC | PRN
  Administered 2023-09-17: 15 ug via INTRATHECAL

## 2023-09-17 MED ORDER — SIMETHICONE 80 MG PO CHEW: 80.0000 mg | CHEWABLE_TABLET | ORAL | Status: DC | PRN

## 2023-09-17 MED ORDER — OXYTOCIN-SODIUM CHLORIDE 30-0.9 UT/500ML-% IV SOLN
INTRAVENOUS | Status: AC
Start: 2023-09-17 — End: ?
  Filled 2023-09-17: qty 500

## 2023-09-17 MED ORDER — SOD CITRATE-CITRIC ACID 500-334 MG/5ML PO SOLN
30.0000 mL | ORAL | Status: AC
  Administered 2023-09-17: 30 mL via ORAL
  Filled 2023-09-17: qty 30

## 2023-09-17 MED ORDER — SCOPOLAMINE 1 MG/3DAYS TD PT72
MEDICATED_PATCH | TRANSDERMAL | Status: AC
  Filled 2023-09-17: qty 1

## 2023-09-17 MED ORDER — DIPHENHYDRAMINE HCL 50 MG/ML IJ SOLN
12.5000 mg | INTRAMUSCULAR | Status: DC | PRN
  Administered 2023-09-17: 12.5 mg via INTRAVENOUS
  Filled 2023-09-17: qty 1

## 2023-09-17 MED ORDER — LACTATED RINGERS IV SOLN: INTRAVENOUS | Status: DC

## 2023-09-17 MED ORDER — EPHEDRINE 5 MG/ML INJ
INTRAVENOUS | Status: AC
  Filled 2023-09-17: qty 5

## 2023-09-17 MED ORDER — ONDANSETRON HCL 4 MG/2ML IJ SOLN: 4.0000 mg | Freq: Three times a day (TID) | INTRAMUSCULAR | Status: DC | PRN

## 2023-09-17 MED ORDER — MEPERIDINE HCL 25 MG/ML IJ SOLN: 6.2500 mg | INTRAMUSCULAR | Status: DC | PRN

## 2023-09-17 MED ORDER — MENTHOL 3 MG MT LOZG: 1.0000 | LOZENGE | OROMUCOSAL | Status: DC | PRN

## 2023-09-17 MED ORDER — HYDROMORPHONE HCL 1 MG/ML IJ SOLN
0.2500 mg | INTRAMUSCULAR | Status: DC | PRN
  Administered 2023-09-17: 0.5 mg via INTRAVENOUS

## 2023-09-17 MED ORDER — ZOLPIDEM TARTRATE 5 MG PO TABS: 5.0000 mg | ORAL_TABLET | Freq: Every evening | ORAL | Status: DC | PRN

## 2023-09-17 MED ORDER — ONDANSETRON HCL 4 MG/2ML IJ SOLN
INTRAMUSCULAR | Status: AC
Start: 2023-09-17 — End: ?
  Filled 2023-09-17: qty 2

## 2023-09-17 MED ORDER — DIPHENHYDRAMINE HCL 25 MG PO CAPS: 25.0000 mg | ORAL_CAPSULE | Freq: Four times a day (QID) | ORAL | Status: DC | PRN

## 2023-09-17 MED ORDER — FENTANYL CITRATE (PF) 100 MCG/2ML IJ SOLN
50.0000 ug | Freq: Once | INTRAMUSCULAR | Status: AC
  Administered 2023-09-17: 50 ug via INTRAVENOUS
  Filled 2023-09-17: qty 2

## 2023-09-17 MED ORDER — OXYTOCIN-SODIUM CHLORIDE 30-0.9 UT/500ML-% IV SOLN: 2.5000 [IU]/h | INTRAVENOUS | Status: AC

## 2023-09-17 MED ORDER — OXYTOCIN-SODIUM CHLORIDE 30-0.9 UT/500ML-% IV SOLN
INTRAVENOUS | Status: DC | PRN
  Administered 2023-09-17: 30 [IU] via INTRAVENOUS

## 2023-09-17 MED ORDER — NALOXONE HCL 0.4 MG/ML IJ SOLN: 0.4000 mg | INTRAMUSCULAR | Status: DC | PRN

## 2023-09-17 MED ORDER — DROPERIDOL 2.5 MG/ML IJ SOLN: 0.6250 mg | Freq: Once | INTRAMUSCULAR | Status: DC | PRN

## 2023-09-17 MED ORDER — MORPHINE SULFATE (PF) 0.5 MG/ML IJ SOLN
INTRAMUSCULAR | Status: DC | PRN
  Administered 2023-09-17: 150 ug via INTRATHECAL

## 2023-09-17 MED ORDER — CEFAZOLIN SODIUM-DEXTROSE 2-4 GM/100ML-% IV SOLN: 2.0000 g | INTRAVENOUS | Status: DC

## 2023-09-17 MED ORDER — DIBUCAINE (PERIANAL) 1 % EX OINT: 1.0000 | TOPICAL_OINTMENT | CUTANEOUS | Status: DC | PRN

## 2023-09-17 MED ORDER — LACTATED RINGERS IV SOLN: INTRAVENOUS | Status: DC | PRN

## 2023-09-17 SURGICAL SUPPLY — 30 items
BENZOIN TINCTURE PRP APPL 2/3 (GAUZE/BANDAGES/DRESSINGS) ×1 IMPLANT
CHLORAPREP W/TINT 26 (MISCELLANEOUS) ×2 IMPLANT
CLAMP UMBILICAL CORD (MISCELLANEOUS) ×1 IMPLANT
CLOTH BEACON ORANGE TIMEOUT ST (SAFETY) ×1 IMPLANT
CLSR STERI-STRIP ANTIMIC 1/2X4 (GAUZE/BANDAGES/DRESSINGS) ×1 IMPLANT
DRSG OPSITE POSTOP 4X10 (GAUZE/BANDAGES/DRESSINGS) ×1 IMPLANT
ELECTRODE REM PT RTRN 9FT ADLT (ELECTROSURGICAL) ×1 IMPLANT
EXTRACTOR VACUUM KIWI (MISCELLANEOUS) IMPLANT
GLOVE BIO SURGEON STRL SZ 6.5 (GLOVE) ×1 IMPLANT
GLOVE BIOGEL PI IND STRL 6.5 (GLOVE) ×1 IMPLANT
GLOVE BIOGEL PI IND STRL 7.0 (GLOVE) ×2 IMPLANT
GOWN STRL REUS W/TWL LRG LVL3 (GOWN DISPOSABLE) ×2 IMPLANT
HEMOSTAT ARISTA ABSORB 3G PWDR (HEMOSTASIS) IMPLANT
KIT ABG SYR 3ML LUER SLIP (SYRINGE) ×1 IMPLANT
MAT PREVALON FULL STRYKER (MISCELLANEOUS) IMPLANT
NDL HYPO 25X5/8 SAFETYGLIDE (NEEDLE) ×1 IMPLANT
NEEDLE HYPO 25X5/8 SAFETYGLIDE (NEEDLE) ×1 IMPLANT
NS IRRIG 1000ML POUR BTL (IV SOLUTION) ×1 IMPLANT
PACK C SECTION WH (CUSTOM PROCEDURE TRAY) ×1 IMPLANT
PAD OB MATERNITY 4.3X12.25 (PERSONAL CARE ITEMS) ×1 IMPLANT
RTRCTR C-SECT PINK 25CM LRG (MISCELLANEOUS) IMPLANT
STRIP CLOSURE SKIN 1/2X4 (GAUZE/BANDAGES/DRESSINGS) IMPLANT
SUT PLAIN 0 NONE (SUTURE) IMPLANT
SUT PLAIN ABS 2-0 CT1 27XMFL (SUTURE) ×1 IMPLANT
SUT VIC AB 0 CT1 36 (SUTURE) ×1 IMPLANT
SUT VIC AB 0 CTX36XBRD ANBCTRL (SUTURE) ×2 IMPLANT
SUT VIC AB 4-0 PS2 27 (SUTURE) ×1 IMPLANT
TOWEL OR 17X24 6PK STRL BLUE (TOWEL DISPOSABLE) ×1 IMPLANT
TRAY FOLEY W/BAG SLVR 14FR LF (SET/KITS/TRAYS/PACK) IMPLANT
WATER STERILE IRR 1000ML POUR (IV SOLUTION) ×1 IMPLANT

## 2023-09-17 NOTE — Transfer of Care (Signed)
 Immediate Anesthesia Transfer of Care Note  Patient: Yolanda Valdez  Procedure(s) Performed: CESAREAN DELIVERY  Patient Location: PACU  Anesthesia Type:Spinal  Level of Consciousness: awake, alert , and oriented  Airway & Oxygen Therapy: Patient Spontanous Breathing  Post-op Assessment: Report given to RN and Post -op Vital signs reviewed and stable  Post vital signs: Reviewed and stable  Last Vitals:  Vitals Value Taken Time  BP 104/62 09/17/23 0542  Temp    Pulse 68 09/17/23 0544  Resp 24 09/17/23 0544  SpO2 100 % 09/17/23 0544  Vitals shown include unfiled device data.  Last Pain:  Vitals:   09/17/23 0300  TempSrc:   PainSc: 9          Complications: No notable events documented.

## 2023-09-17 NOTE — MAU Note (Signed)
 MAU Labor Triage Note: .Yolanda Valdez is a 23 y.o. at [redacted]w[redacted]d here in MAU reporting:  Contractions every: 5 minutes Onset of ctx: yesterday morning Pain Score: 9  Pain Location: Abdomen  ROM: reports feeling a "pop" while lying in bed and feeling a gush of clear, watery fluid. Happened around 0045. Vaginal Bleeding: bloody show Last SVE: 2/50/-2 yesterday Labor Pain Management Plan: Undecided  Fetal Movement: Reports decreased FM FHT: Fetal Heart Rate Mode: External Baseline Rate (A): 140 bpm  Vitals:   09/17/23 0140  BP: 109/64  Pulse: 86  Resp: 18  Temp: 98 F (36.7 C)  SpO2: 100%     OB Office: [facility] GBS: Negative Lab orders placed from triage: MAU Labor Eval

## 2023-09-17 NOTE — Anesthesia Procedure Notes (Addendum)
 Spinal  Patient location during procedure: OR Start time: 09/17/2023 4:12 AM End time: 09/17/2023 4:18 AM Reason for block: surgical anesthesia Staffing Performed: anesthesiologist  Anesthesiologist: Gorman Laughter, MD Performed by: Gorman Laughter, MD Authorized by: Gorman Laughter, MD   Preanesthetic Checklist Completed: patient identified, IV checked, site marked, risks and benefits discussed, surgical consent, monitors and equipment checked, pre-op evaluation and timeout performed Spinal Block Patient position: sitting Prep: DuraPrep and site prepped and draped Patient monitoring: heart rate, continuous pulse ox and blood pressure Location: L3-4 Injection technique: single-shot Needle Needle type: Spinocan  Needle gauge: 25 G Needle length: 9 cm Additional Notes Expiration date of kit checked and confirmed. Patient tolerated procedure well, without complications.

## 2023-09-17 NOTE — Op Note (Signed)
 Operative Note  PROCEDURE DATE: 09/17/23   PREOPERATIVE DIAGNOSIS: History of prior C section, ROM, labor   POSTOPERATIVE DIAGNOSIS: The same   PROCEDURE:    Repeat Low Transverse Cesarean Section   SURGEON:  Dr. Isidor Marek   INDICATIONS: This is a 23 y.o. yo G2P2002 at [redacted]w[redacted]d requiring cesarean section secondary to ruptured membranes, labor, and history of prior C section.   Decision made to proceed with LTCS. The risks of cesarean section discussed with the patient included but were not limited to: bleeding which may require transfusion or reoperation; infection which may require antibiotics; injury to bowel, bladder, ureters or other surrounding organs; injury to the fetus; need for additional procedures including hysterectomy in the event of a life-threatening hemorrhage; placental abnormalities wth subsequent pregnancies, incisional problems, thromboembolic phenomenon and other postoperative/anesthesia complications. The patient agreed with the proposed plan, giving informed consent for the procedure.     FINDINGS:  Viable female infant in vertex presentation, APGARs per NICU,  Weight pending, Amniotic fluid clear,  Intact placenta, three vessel cord.  Grossly normal uterus  .   ANESTHESIA:    Epidural ESTIMATED BLOOD LOSS: Pend SPECIMENS: Placenta for Labor and delivery COMPLICATIONS: None immediate    PROCEDURE IN DETAIL:  The patient received intravenous antibiotics (2g Ancef  and 500 mg Azithromycin ) and had sequential compression devices applied to her lower extremities while in the preoperative area.  She was then taken to the operating room where epidural anesthesia was dosed up to surgical level and was found to be adequate. She was then placed in a dorsal supine position with a leftward tilt, and prepped and draped in a sterile manner.  A foley catheter was placed into her bladder and attached to constant gravity.  After an adequate timeout was performed, a Pfannenstiel skin  incision was made with scalpel and carried through to the underlying layer of fascia. The fascia was incised in the midline and this incision was extended bilaterally using the Mayo scissors. Kocher clamps were applied to the superior aspect of the fascial incision and the underlying rectus muscles were dissected off bluntly. A similar process was carried out on the inferior aspect of the facial incision. The rectus muscles were separated in the midline bluntly and the peritoneum was entered bluntly.  A bladder flap was created sharply and developed bluntly. A transverse hysterotomy was made with a scalpel and extended bilaterally bluntly. The bladder blade was then removed. The infant was successfully delivered, and cord was clamped and cut and infant was handed over to awaiting neonatology team. Uterine massage was then administered and the placenta delivered intact with three-vessel cord. Cord gases. The uterus was cleared of clot and debris.  The hysterotomy was closed with 0 vicryl.  A second imbricating suture of 0-vicryl was used to reinforce the incision and aid in hemostasis.The fascia was closed with 0-Vicryl in a running fashion with good restoration of anatomy.  The subcutaneus tissue was irrigated and was reapproximated using running plain gut.  The skin was closed with 4-0 Vicryl in a subcuticular fashion.  All surgical site and was hemostatic at end of procedure without any further bleeding on exam.    Pt tolerated the procedure well. All sponge/lap/needle counts were correct  X 2. Pt taken to recovery room in stable condition.     Isidor Marek MD

## 2023-09-17 NOTE — H&P (Signed)
 OB History and Physical   Yolanda Valdez is a 23 y.o. female G2P1001 presenting for ROM, contractions at [redacted]w[redacted]d.  She has a history of prior C section and is planning on a repeat CS.   Rh positive, GBS negative, panorama low risk.    OB History     Gravida  2   Para  1   Term  1   Preterm  0   AB  0   Living  1      SAB  0   IAB  0   Ectopic  0   Multiple  0   Live Births  1          Past Medical History:  Diagnosis Date   Low serum vitamin B12    Medical history non-contributory    Past Surgical History:  Procedure Laterality Date   CESAREAN SECTION N/A 10/04/2021   Procedure: CESAREAN SECTION;  Surgeon: Tresia Fruit, MD;  Location: [facility] LD ORS;  Service: Obstetrics;  Laterality: N/A;   NO PAST SURGERIES     Family History: family history includes Diabetes in her mother and paternal grandfather; Diabetes type II in her mother; Hypertension in her mother and paternal grandfather. Social History:  reports that she has never smoked. She has never used smokeless tobacco. She reports that she does not drink alcohol and does not use drugs.     Maternal Diabetes: No Genetic Screening: Normal Maternal Ultrasounds/Referrals: Normal Fetal Ultrasounds or other Referrals:  None Maternal Substance Abuse:  No Significant Maternal Medications:  None Significant Maternal Lab Results:  Group B Strep negative Other Comments:  None  Review of Systems - Patient denies fever, chills, SOB, CP, N/V/D.  History Dilation: 2 Effacement (%): 70 Station: -2 Exam by:: Gae Jointer, RN Blood pressure 114/72, pulse 96, temperature 98 F (36.7 C), temperature source Oral, resp. rate 18, height 5\' 3"  (1.6 m), weight 75.3 kg, last menstrual period 12/18/2022, SpO2 100%, not currently breastfeeding. Exam Physical Exam   Gen: alert, well appearing, no distress Chest: nonlabored breathing CV: no peripheral edema Abdomen: soft, gravid  Ext: no evidence of DVT  Prenatal  labs: ABO, Rh:   Antibody: Negative (12/02 0000) Rubella: Immune (12/02 0000) RPR: Nonreactive (12/02 0000)  HBsAg: Negative (12/02 0000)  HIV: Non-reactive (12/02 0000)  GBS: Negative/-- (05/09 0000)   Assessment/Plan: Admit to Labor and Delivery Patient was counseled on the risks of Cesarean delivery, which include but are not limited to bleeding, infection, damage to nearby organs including bowel/bladder/ureter, need for additional procedure or blood transfusion, and implications for future pregnancy.  Patient agreeable to procedure, all questions answered.  GBS negative Last ate dinner at 8 pm, will await NPO time per anesthesia, move up for progression of labor OR aware   Gretchen Leavell 09/17/2023, 2:08 AM

## 2023-09-17 NOTE — Anesthesia Postprocedure Evaluation (Signed)
 Anesthesia Post Note  Patient: Yolanda Valdez  Procedure(s) Performed: CESAREAN DELIVERY     Patient location during evaluation: PACU Anesthesia Type: Spinal Level of consciousness: awake and alert Pain management: pain level controlled Vital Signs Assessment: post-procedure vital signs reviewed and stable Respiratory status: spontaneous breathing Cardiovascular status: stable Anesthetic complications: no  No notable events documented.  Last Vitals:  Vitals:   09/17/23 1112 09/17/23 1528  BP: 109/62 (!) 96/54  Pulse: 73 65  Resp: 20 18  Temp:  36.8 C  SpO2: 99% 99%    Last Pain:  Vitals:   09/17/23 1528  TempSrc: Oral  PainSc: 0-No pain                 Gorman Laughter

## 2023-09-17 NOTE — Lactation Note (Signed)
 This note was copied from a baby's chart. Lactation Consultation Note  Patient Name: Yolanda Valdez [id number] Date: 09/17/2023 Age:24 hours Reason for consult: Initial assessment;Term  P2, 39 wks, @ 8 hrs of life. Mom chooses to breast and formula feed by choice. Mom only formula fed since birth- "plans to breast feed but doesn't feel well." Encouraged mom her milk comes easier/stronger with each baby. Highlighted breast stimulation in the early days ties directly to milk production. Hand pump provided to mom with couple flanges- encouraged mom sizes are dynamic, verify with each use.  Discussed expectations @ breast- Day 1- sleepy/ feed every 3 hrs/ even 10 minutes is okay, Day 2 more awake/ feeding cues/longer feeds, and cluster feeding overnights brings milk in. Highlighted breast stimulation is tied directly to milk production. Discussed hands on breast and baby, keeping baby awake @ breast. Starting with hand expression & breast compression to get baby working @ breast, and gentle stimulation to keep baby working @ breast. Encouraged EBM for nipple care post feed. LC services and milk storage shared. Encouraged mom to call for assist anytime desired.  Maternal Data Does the patient have breastfeeding experience prior to this delivery?: Yes How long did the patient breastfeed?: When asked how long mom breast fed last time- her response was "not long"  Feeding Mother's Current Feeding Choice: Breast Milk and Formula Nipple Type: Slow - flow   Lactation Tools Discussed/Used Tools: Flanges;Pump Breast pump type: Manual Pump Education: Milk Storage  Interventions Interventions: Hand pump;Education;LC Services brochure;CDC milk storage guidelines  Discharge Pump: DEBP;Manual;Personal  Consult Status Consult Status: Follow-up Date: 09/18/23 Follow-up type: In-patient    Yolanda Valdez 09/17/2023, 1:38 PM

## 2023-09-17 NOTE — Anesthesia Preprocedure Evaluation (Signed)
 Anesthesia Evaluation  Patient identified by MRN, date of birth, ID band Patient awake    Reviewed: Allergy & Precautions, NPO status , Patient's Chart, lab work & pertinent test results  History of Anesthesia Complications Negative for: history of anesthetic complications  Airway Mallampati: II  TM Distance: >3 FB Neck ROM: Full    Dental no notable dental hx.    Pulmonary neg pulmonary ROS   Pulmonary exam normal        Cardiovascular negative cardio ROS Normal cardiovascular exam     Neuro/Psych negative neurological ROS  negative psych ROS   GI/Hepatic negative GI ROS, Neg liver ROS,,,  Endo/Other  negative endocrine ROS    Renal/GU negative Renal ROS     Musculoskeletal negative musculoskeletal ROS (+)    Abdominal   Peds  Hematology negative hematology ROS (+)   Anesthesia Other Findings Day of surgery medications reviewed with patient.  Reproductive/Obstetrics (+) Pregnancy (breech presentation, IUGR)                             Anesthesia Physical Anesthesia Plan  ASA: 2  Anesthesia Plan: Spinal   Post-op Pain Management: Ofirmev  IV (intra-op)* and Toradol  IV (intra-op)*   Induction:   PONV Risk Score and Plan: 4 or greater and Treatment may vary due to age or medical condition, Dexamethasone  and Ondansetron   Airway Management Planned: Natural Airway  Additional Equipment:   Intra-op Plan:   Post-operative Plan:   Informed Consent: I have reviewed the patients History and Physical, chart, labs and discussed the procedure including the risks, benefits and alternatives for the proposed anesthesia with the patient or authorized representative who has indicated his/her understanding and acceptance.       Plan Discussed with: CRNA  Anesthesia Plan Comments:         Anesthesia Quick Evaluation

## 2023-09-18 HISTORY — DX: Deficiency of other specified B group vitamins: E53.8

## 2023-09-18 LAB — CBC
HCT: 27.9 % — ABNORMAL LOW (ref 36.0–46.0)
Hemoglobin: 8.9 g/dL — ABNORMAL LOW (ref 12.0–15.0)
MCH: 24.8 pg — ABNORMAL LOW (ref 26.0–34.0)
MCHC: 31.9 g/dL (ref 30.0–36.0)
MCV: 77.7 fL — ABNORMAL LOW (ref 80.0–100.0)
Platelets: 195 10*3/uL (ref 150–400)
RBC: 3.59 MIL/uL — ABNORMAL LOW (ref 3.87–5.11)
RDW: 14.5 % (ref 11.5–15.5)
WBC: 12.3 10*3/uL — ABNORMAL HIGH (ref 4.0–10.5)
nRBC: 0 % (ref 0.0–0.2)

## 2023-09-18 NOTE — Lactation Note (Signed)
 This note was copied from a baby's chart. Lactation Consultation Note  Patient Name: Yolanda Valdez [id number] Date: 09/18/2023 Age:24 hours Reason for consult: Follow-up assessment  P2, 39 wks, @ 33 hrs of life. A couple breast feeding sessions overnight. LC rounded- praised mom- feeling better. Mom shares she still feels like she doesn't have very much. Re-assured mom that is very normal- cluster feeding overnights 2 & 3 really signal to our body to increase our milk supply. Encouraged hand expression and breast compression if baby irritable our breast doesn't flow like a bottle- mom agrees she's doing this. Praised her efforts, encouraged mom the breast stimulation is perfect to bring in the milk, keep working @ latching baby. Encouraged mom to call for assist anytime.  Maternal Data Does the patient have breastfeeding experience prior to this delivery?: No How long did the patient breastfeed?: " Not very long" per mom  Feeding Mother's Current Feeding Choice: Breast Milk and Formula  Interventions Interventions: Breast feeding basics reviewed;Hand express;Breast compression;Coconut oil;Education  Discharge Pump: DEBP;Manual;Personal  Consult Status Consult Status: Follow-up Date: 09/18/23 Follow-up type: In-patient    [name] 09/18/2023, 2:28 PM

## 2023-09-18 NOTE — Progress Notes (Addendum)
 Subjective: Postpartum Day 1: Cesarean Delivery Patient reports tolerating PO and no problems voiding.    Objective: Vital signs in last 24 hours: Temp:  [97.5 F (36.4 C)-98.2 F (36.8 C)] 97.5 F (36.4 C) (05/27 0458) Pulse Rate:  [65-77] 73 (05/27 0458) Resp:  [16-20] 18 (05/27 0458) BP: (96-118)/(54-64) 99/55 (05/27 0458) SpO2:  [99 %-100 %] 100 % (05/27 0458)  Physical Exam:  General: alert Lochia: appropriate Uterine Fundus: firm Incision: healing well DVT Evaluation: No evidence of DVT seen on physical exam.  Recent Labs    09/17/23 0156 09/18/23 0451  HGB 10.5* 8.9*  HCT 33.3* 27.9*    Assessment/Plan: Status post Cesarean section. Doing well postoperatively.  Acute blood loss anemia - denies s/s and none on exam or vs. Continue PNV with Fe.  Continue current care.   [name], MD 09/18/2023, 8:57 AM

## 2023-09-19 MED ORDER — OXYCODONE HCL 5 MG PO TABS: 5.0000 mg | ORAL_TABLET | Freq: Four times a day (QID) | ORAL | 0 refills | Status: DC | PRN

## 2023-09-19 MED ORDER — DIPHENHYDRAMINE HCL 50 MG/ML IJ SOLN: 25.0000 mg | Freq: Once | INTRAMUSCULAR | Status: DC | PRN

## 2023-09-19 MED ORDER — ALBUTEROL SULFATE (2.5 MG/3ML) 0.083% IN NEBU: 2.5000 mg | INHALATION_SOLUTION | Freq: Once | RESPIRATORY_TRACT | Status: DC | PRN

## 2023-09-19 MED ORDER — METHYLPREDNISOLONE SODIUM SUCC 125 MG IJ SOLR: 125.0000 mg | Freq: Once | INTRAMUSCULAR | Status: DC | PRN

## 2023-09-19 MED ORDER — SODIUM CHLORIDE 0.9 % IV SOLN
500.0000 mg | Freq: Once | INTRAVENOUS | Status: AC
  Administered 2023-09-19: 500 mg via INTRAVENOUS
  Filled 2023-09-19: qty 25

## 2023-09-19 MED ORDER — SODIUM CHLORIDE 0.9 % IV SOLN: INTRAVENOUS | Status: DC | PRN

## 2023-09-19 MED ORDER — IRON SUCROSE 500 MG IVPB - SIMPLE MED
500.0000 mg | Freq: Once | INTRAVENOUS | Status: DC
  Filled 2023-09-19: qty 275

## 2023-09-19 MED ORDER — EPINEPHRINE 0.3 MG/0.3ML IJ SOAJ: 0.3000 mg | Freq: Once | INTRAMUSCULAR | Status: DC | PRN

## 2023-09-19 MED ORDER — IBUPROFEN 600 MG PO TABS: 600.0000 mg | ORAL_TABLET | Freq: Four times a day (QID) | ORAL | 0 refills | Status: DC | PRN

## 2023-09-19 MED ORDER — SODIUM CHLORIDE 0.9 % IV BOLUS
500.0000 mL | Freq: Once | INTRAVENOUS | Status: DC | PRN
Start: 2023-09-19 — End: 2023-09-19

## 2023-09-19 MED ORDER — ACETAMINOPHEN 325 MG PO TABS: 650.0000 mg | ORAL_TABLET | Freq: Four times a day (QID) | ORAL | 0 refills | Status: DC | PRN

## 2023-09-19 NOTE — Discharge Summary (Signed)
 Postpartum Discharge Summary  Date of Service updated 09/19/23     Patient Name: Yolanda Valdez DOB: [birth year] MRN: [id number]  Date of admission: 09/17/2023 Delivery date:09/17/2023 Delivering provider: Jeannett Million A Date of discharge: 09/19/2023  Admitting diagnosis: History of cesarean section [Z98.891] Pregnancy [Z34.90] Intrauterine pregnancy: [redacted]w[redacted]d     Secondary diagnosis:  Principal Problem:   History of cesarean section Active Problems:   Pregnancy  Additional problems: anemia post operatively    Discharge diagnosis: Term Pregnancy Delivered                                              Post partum procedures:IV Venofer  x 1 Augmentation: N/A Complications: None  Hospital course: Sceduled C/S   23 y.o. yo G2P2002 at [redacted]w[redacted]d was admitted to the hospital 09/17/2023 for scheduled cesarean section with the following indication:Elective Repeat.Delivery details are as follows:  Membrane Rupture Time/Date: 12:45 AM,09/17/2023  Delivery Method:C-Section, Low Transverse Operative Delivery:N/A Details of operation can be found in separate operative note.  Patient had a postpartum course complicated by post operative anemia.  She is ambulating, tolerating a regular diet, passing flatus, and urinating well. Patient is discharged home in stable condition on  09/19/23        Newborn Data: Birth date:09/17/2023 Birth time:4:48 AM Gender:Female Living status:Living Apgars:7 ,8  Weight:3310 g    Magnesium Sulfate received: No BMZ received: No Rhophylac:No MMR:No T-DaP:Given prenatally Flu:  RSV Vaccine received:  Transfusion:No Immunizations administered: Immunization History  Administered Date(s) Administered   Influenza,inj,Quad PF,6+ Mos 03/22/2021   PFIZER(Purple Top)SARS-COV-2 Vaccination 09/28/2019, 11/07/2019   Tdap 07/28/2021    Physical exam  Vitals:   09/18/23 0458 09/18/23 1339 09/18/23 2015 09/19/23 0549  BP: (!) 99/55 96/60 107/60 (!) 105/59  Pulse: 73  65 73 67  Resp: 18 18 18 16   Temp: (!) 97.5 F (36.4 C) 98.5 F (36.9 C) 98.3 F (36.8 C) 97.9 F (36.6 C)  TempSrc: Oral Oral Oral Oral  SpO2: 100% 95% 99% 100%  Weight:      Height:       General: alert, cooperative, and no distress Lochia: appropriate Uterine Fundus: firm Incision: Healing well with no significant drainage DVT Evaluation: No evidence of DVT seen on physical exam. Labs: Lab Results  Component Value Date   WBC 12.3 (H) 09/18/2023   HGB 8.9 (L) 09/18/2023   HCT 27.9 (L) 09/18/2023   MCV 77.7 (L) 09/18/2023   PLT 195 09/18/2023      Latest Ref Rng & Units 07/12/2023    2:12 PM  CMP  Glucose 70 - 99 mg/dL 84   BUN 6 - 23 mg/dL 5   Creatinine 4.09 - 8.11 mg/dL 9.14   Sodium 782 - 956 mEq/L 135   Potassium 3.5 - 5.1 mEq/L 3.5   Chloride 96 - 112 mEq/L 105   CO2 19 - 32 mEq/L 22   Calcium 8.4 - 10.5 mg/dL 8.7   Total Protein 6.0 - 8.3 g/dL 6.7   Total Bilirubin 0.2 - 1.2 mg/dL 0.2   Alkaline Phos 39 - 117 U/L 80   AST 0 - 37 U/L 12   ALT 0 - 35 U/L 7    Edinburgh Score:    09/17/2023    7:49 AM  Edinburgh Postnatal Depression Scale Screening Tool  I have been able to laugh and see  the funny side of things. 0  I have looked forward with enjoyment to things. 0  I have blamed myself unnecessarily when things went wrong. 0  I have been anxious or worried for no good reason. 2  I have felt scared or panicky for no good reason. 1  Things have been getting on top of me. 1  I have been so unhappy that I have had difficulty sleeping. 0  I have felt sad or miserable. 0  I have been so unhappy that I have been crying. 0  The thought of harming myself has occurred to me. 0  Edinburgh Postnatal Depression Scale Total 4      After visit meds:  Allergies as of 09/19/2023       Reactions   Pork-derived Products         Medication List     STOP taking these medications    cyclobenzaprine 10 MG tablet Commonly known as: FLEXERIL       TAKE  these medications    acetaminophen  325 MG tablet Commonly known as: TYLENOL  Take 2 tablets (650 mg total) by mouth every 6 (six) hours as needed for mild pain (pain score 1-3) (temperature > 101.5.).   ibuprofen  600 MG tablet Commonly known as: ADVIL  Take 1 tablet (600 mg total) by mouth every 6 (six) hours as needed.   oxyCODONE  5 MG immediate release tablet Commonly known as: Oxy IR/ROXICODONE  Take 1 tablet (5 mg total) by mouth every 6 (six) hours as needed for moderate pain (pain score 4-6).         Discharge home in stable condition Infant Feeding: Bottle Infant Disposition:home with mother Discharge instruction: per After Visit Summary and Postpartum booklet. Activity: Advance as tolerated. Pelvic rest for 6 weeks.  Diet: routine diet Anticipated Birth Control: Unsure Postpartum Appointment:6 weeks Additional Postpartum F/U:  Future Appointments:No future appointments. Follow up Visit:      09/19/2023 Jeanmarie Millet, MD

## 2023-09-19 NOTE — Progress Notes (Signed)
 Subjective: Postpartum Day 2: Cesarean Delivery Patient reports tolerating PO, + flatus, and no problems voiding.    Objective: Vital signs in last 24 hours: Temp:  [97.9 F (36.6 C)-98.5 F (36.9 C)] 97.9 F (36.6 C) (05/28 0549) Pulse Rate:  [65-73] 67 (05/28 0549) Resp:  [16-18] 16 (05/28 0549) BP: (96-107)/(59-60) 105/59 (05/28 0549) SpO2:  [95 %-100 %] 100 % (05/28 0549)  Physical Exam:  General: alert, cooperative, and no distress Lochia: appropriate Uterine Fundus: firm Incision: healing well DVT Evaluation: No evidence of DVT seen on physical exam.  Recent Labs    09/17/23 0156 09/18/23 0451  HGB 10.5* 8.9*  HCT 33.3* 27.9*    Assessment/Plan: Status post Cesarean section. Doing well postoperatively.  Discharge home with standard precautions and return to clinic in 4-6 weeks. D/W anemia. She has tried multiple oral supplements and vitamins and cannot tolerate any of them. She states she cannot take PNV either. She requests IV iron . Will give IV Venofer  x 1 and then discharge. D/W discharge instructions. Juhi Lagrange E Chrisotpher Rivero II, MD 09/19/2023, 8:32 AM

## 2023-09-19 NOTE — Lactation Note (Signed)
 This note was copied from a baby's chart. Lactation Consultation Note  Patient Name: Yolanda Valdez [id number] Date: 09/19/2023 Age:24 hours Reason for consult: Follow-up assessment  P2, Mother states baby does not like breastfeeding and does not latch.   Baby has primarily been formula feeding.  Noted pacifier in room.  Pacifier use not recommended at this time.  Attempted with and without #20NS. Baby was able to sustain latch with #20NS.   Discussed that baby is used to artificial nipple and likely could be now refusing the breast. Discussed taking off nipple shield half way through feeding. Recommend post pumping every other feeding and give volume back to baby.  Mother can prefill nipple shield. Reviewed engorgement care and monitoring voids/stools. Mother has DEBP at home.  Suggest calling for help as needed.  Maternal Data Has patient been taught Hand Expression?: Yes  Feeding Mother's Current Feeding Choice: Breast Milk and Formula  Lactation Tools Discussed/Used Tools: Flanges;Pump;Nipple Shields Nipple shield size: 20  Interventions Interventions: Education  Discharge Discharge Education: Engorgement and breast care;Warning signs for feeding baby Pump: Personal;DEBP  Consult Status Consult Status: Complete Date: 09/19/23    Vicenta Graft [facility] 09/19/2023, 1:49 PM

## 2023-09-27 ENCOUNTER — Telehealth (HOSPITAL_COMMUNITY): Payer: Self-pay | Admitting: *Deleted

## 2023-09-27 NOTE — Telephone Encounter (Signed)
 09/27/2023  Name: Yolanda Valdez MRN: [id number] DOB: 20-Oct-2000  Reason for Call:  Transition of Care Hospital Discharge Call  Contact Status: Patient Contact Status: Message  Language assistant needed:          Follow-Up Questions:    Dimple Francis Postnatal Depression Scale:  In the Past 7 Days:    PHQ2-9 Depression Scale:     Discharge Follow-up:    Post-discharge interventions: NA  Pearlie Bougie, RN 09/27/2023 14:47

## 2023-10-30 DIAGNOSIS — Z3202 Encounter for pregnancy test, result negative: Secondary | ICD-10-CM | POA: Diagnosis not present

## 2023-10-30 DIAGNOSIS — Z30430 Encounter for insertion of intrauterine contraceptive device: Secondary | ICD-10-CM | POA: Diagnosis not present

## 2024-01-10 ENCOUNTER — Ambulatory Visit: Payer: Self-pay

## 2024-01-10 NOTE — Telephone Encounter (Signed)
 Noted Patient has an OV on 01/11/2024 With PCP

## 2024-01-10 NOTE — Telephone Encounter (Signed)
  FYI Only or Action Required?: FYI only for provider.  Patient was last seen in primary care on 07/12/2023 by Kennyth Worth HERO, MD.  Called Nurse Triage reporting Back Pain.  Symptoms began about a month ago.  Interventions attempted: OTC medications: ibuprofen , tylenol  (oxy x 1).  Symptoms are: unchanged.  Triage Disposition: See PCP When Office is Open (Within 3 Days)  Patient/caregiver understands and will follow disposition?: Yes Copied from CRM [id number]. Topic: Clinical - Red Word Triage >> Jan 10, 2024 11:52 AM Dedra NOVAK wrote: Red Word that prompted transfer to Nurse Triage: Pt having lower back pain. Warm transfer to nurse triage. Reason for Disposition  [1] Pain radiates into the thigh or further down the leg AND [2] one leg  Answer Assessment - Initial Assessment Questions 1. ONSET: When did the pain begin? (e.g., minutes, hours, days)     One month ago 2. LOCATION: Where does it hurt? (upper, mid or lower back)     Low back pain 3. SEVERITY: How bad is the pain?  (e.g., Scale 1-10; mild, moderate, or severe)     8/10 4. PATTERN: Is the pain constant? (e.g., yes, no; constant, intermittent)      intermittent 5. RADIATION: Does the pain shoot into your legs or somewhere else?     Right leg 6. CAUSE:  What do you think is causing the back pain?      unknown 7. BACK OVERUSE:  Any recent lifting of heavy objects, strenuous work or exercise?     denies 8. MEDICINES: What have you taken so far for the pain? (e.g., nothing, acetaminophen , NSAIDS)     Ibuprofen , oxy x 1 (left over from recent c section) no relief 9. NEUROLOGIC SYMPTOMS: Do you have any weakness, numbness, or problems with bowel/bladder control?     denies 10. OTHER SYMPTOMS: Do you have any other symptoms? (e.g., fever, abdomen pain, burning with urination, blood in urine)       denies 11. PREGNANCY: Is there any chance you are pregnant? When was your last menstrual period?        IUD, irregular periods  Protocols used: Back Pain-A-AH

## 2024-01-11 ENCOUNTER — Ambulatory Visit (INDEPENDENT_AMBULATORY_CARE_PROVIDER_SITE_OTHER): Admitting: Family Medicine

## 2024-01-11 ENCOUNTER — Encounter: Payer: Self-pay | Admitting: Family Medicine

## 2024-01-11 VITALS — BP 96/61 | HR 78 | Temp 98.1°F | Ht 63.0 in | Wt 140.0 lb

## 2024-01-11 DIAGNOSIS — E538 Deficiency of other specified B group vitamins: Secondary | ICD-10-CM | POA: Diagnosis not present

## 2024-01-11 DIAGNOSIS — E559 Vitamin D deficiency, unspecified: Secondary | ICD-10-CM | POA: Diagnosis not present

## 2024-01-11 MED ORDER — VITAMIN D3 250 MCG (10000 UT) PO CAPS: 10000.0000 [IU] | ORAL_CAPSULE | Freq: Every day | ORAL | 0 refills | Status: DC

## 2024-01-11 MED ORDER — CYCLOBENZAPRINE HCL 10 MG PO TABS: 10.0000 mg | ORAL_TABLET | Freq: Three times a day (TID) | ORAL | 0 refills | Status: DC | PRN

## 2024-01-11 MED ORDER — MELOXICAM 15 MG PO TABS: 15.0000 mg | ORAL_TABLET | Freq: Every day | ORAL | 0 refills | Status: DC

## 2024-01-11 MED ORDER — VITAMIN D (ERGOCALCIFEROL) 1.25 MG (50000 UNIT) PO CAPS: 50000.0000 [IU] | ORAL_CAPSULE | ORAL | 1 refills | Status: DC

## 2024-01-11 NOTE — Assessment & Plan Note (Signed)
 Refilled vitamin D  today.  Recheck next blood draw.

## 2024-01-11 NOTE — Progress Notes (Signed)
   Yolanda Valdez is a 23 y.o. female who presents today for an office visit.  Assessment/Plan:  New/Acute Problems: Low back pain with sciatica No red flags.  Exam and history is consistent with sciatica.  Positive straight leg raise on exam.  May have some component of piriformis syndrome.  We will start meloxicam  and Flexeril .  We did discuss imaging and referral to PT however will try above medications first.  We also did discuss home exercise program and handout was given.  We discussed reasons to return to care.  She will let us  know if not improving in the next couple of weeks  Chronic Problems Addressed Today: Vitamin D  deficiency Refilled vitamin D  today.  Recheck next blood draw.  B12 deficiency Check B12 next blood draw.     Subjective:  HPI:  See assessment / plan for status of chronic conditions.   Discussed the use of AI scribe software for clinical note transcription with the patient, who gave verbal consent to proceed.  History of Present Illness Yolanda Valdez is a 23 year old female who presents with persistent lower back pain.  She has been experiencing lower back pain for the past month, localized to the right lower back and radiating down the leg, particularly when lifting the leg. The pain is described as intermittent and has remained consistent over the past few weeks. It worsens when changing positions while sleeping and when holding her four-month-old baby.  She has been taking ibuprofen  for pain relief and has attempted exercises found online, but these have not provided significant relief. She visited a chiropractor once, which helped temporarily. There have been no falls or heavy lifting incidents, and she had a C-section with her second child, which has limited her ability to lift heavy objects.  The pain worsens when lifting her leg. She has been trying to manage the pain with exercises and medication but finds it challenging due to her responsibilities with  her young child.         Objective:  Physical Exam: BP 96/61   Pulse 78   Temp 98.1 F (36.7 C) (Temporal)   Ht 5' 3 (1.6 m)   Wt 140 lb (63.5 kg)   SpO2 98%   BMI 24.80 kg/m   Gen: No acute distress, resting comfortably MUSCULOSKELETAL: - Back: No deformities.  Tender to palpation along the right lower lumbar paraspinal muscles - Legs: no deformities.  Full range of motion throughout.  Straight leg raise positive on the right.  Neurovascular intact distally Neuro: Grossly normal, moves all extremities Psych: Normal affect and thought content      Yolanda Valdez M. Kennyth, MD 01/11/2024 2:18 PM

## 2024-01-11 NOTE — Assessment & Plan Note (Signed)
Check B12 next blood draw. 

## 2024-01-11 NOTE — Patient Instructions (Addendum)
 It was very nice to see you today!  VISIT SUMMARY: You visited us  today due to persistent lower back pain that has been radiating down your leg for the past month. We discussed your symptoms and developed a plan to help manage your pain and improve your condition.  YOUR PLAN: SCIATICA WITH RIGHT LOWER BACK PAIN: You have been experiencing intermittent right lower back pain that radiates down your leg, likely due to a pinched nerve or muscle spasm. -We have prescribed an anti-inflammatory medication and a muscle relaxant for nighttime use to help manage your pain. -You will receive a handout with home stretches and exercises to try. -Please try the medication and exercises for a couple of weeks and monitor your progress. -If needed, we can refer you to [facility] for osteopathic treatment.  VITAMIN D  DEFICIENCY: You have a known vitamin D  deficiency. -We have refilled your vitamin D  prescription.  Return if symptoms worsen or fail to improve.   Take care, Dr Kennyth  PLEASE NOTE:  If you had any lab tests, please let us  know if you have not heard back within a few days. You may see your results on mychart before we have a chance to review them but we will give you a call once they are reviewed by us .   If we ordered any referrals today, please let us  know if you have not heard from their office within the next week.   If you had any urgent prescriptions sent in today, please check with the pharmacy within an hour of our visit to make sure the prescription was transmitted appropriately.   Please try these tips to maintain a healthy lifestyle:  Eat at least 3 REAL meals and 1-2 snacks per day.  Aim for no more than 5 hours between eating.  If you eat breakfast, please do so within one hour of getting up.   Each meal should contain half fruits/vegetables, one quarter protein, and one quarter carbs (no bigger than a computer mouse)  Cut down on sweet beverages. This  includes juice, soda, and sweet tea.   Drink at least 1 glass of water  with each meal and aim for at least 8 glasses per day  Exercise at least 150 minutes every week.

## 2024-05-16 ENCOUNTER — Encounter: Payer: Self-pay | Admitting: Family Medicine

## 2024-05-16 ENCOUNTER — Ambulatory Visit: Admitting: Family Medicine

## 2024-05-16 VITALS — BP 98/60 | HR 77 | Temp 98.2°F | Ht 63.0 in | Wt 130.0 lb

## 2024-05-16 DIAGNOSIS — M79672 Pain in left foot: Secondary | ICD-10-CM

## 2024-05-16 MED ORDER — MELOXICAM 15 MG PO TABS: 15.0000 mg | ORAL_TABLET | Freq: Every day | ORAL | 0 refills | Status: AC

## 2024-05-16 NOTE — Patient Instructions (Signed)
 It was very nice to see you today!  VISIT SUMMARY: Today, you came in with right foot pain that has been bothering you for less than two weeks. The pain is mainly in the joint area and worsens with walking or driving. You have tried various treatments without significant relief.  YOUR PLAN: METATARSALGIA: Pain in the metatarsal region likely due to increased pressure from activity and flat shoes. -Start taking meloxicam  as prescribed. -Use metatarsal pads for added support. -Avoid going barefoot; wear supportive shoes even at home. -Use a foam roller to help alleviate pain. -If there is no improvement, consider seeing a podiatrist for orthotics. -If symptoms persist, we may need to get an x-ray.  Return if symptoms worsen or fail to improve.   Take care, Dr Kennyth  PLEASE NOTE:  If you had any lab tests, please let us  know if you have not heard back within a few days. You may see your results on mychart before we have a chance to review them but we will give you a call once they are reviewed by us .   If we ordered any referrals today, please let us  know if you have not heard from their office within the next week.   If you had any urgent prescriptions sent in today, please check with the pharmacy within an hour of our visit to make sure the prescription was transmitted appropriately.   Please try these tips to maintain a healthy lifestyle:  Eat at least 3 REAL meals and 1-2 snacks per day.  Aim for no more than 5 hours between eating.  If you eat breakfast, please do so within one hour of getting up.   Each meal should contain half fruits/vegetables, one quarter protein, and one quarter carbs (no bigger than a computer mouse)  Cut down on sweet beverages. This includes juice, soda, and sweet tea.   Drink at least 1 glass of water  with each meal and aim for at least 8 glasses per day  Exercise at least 150 minutes every week.

## 2024-05-16 NOTE — Progress Notes (Signed)
" ° °  Yolanda Valdez is a 24 y.o. female who presents today for an office visit.  Assessment/Plan:  Left Foot Pain Exam consistent with metatarsalgia.  Unlikely gout flare.  We discussed treatment options.  Will start meloxicam .  Also recommended good foot support and metatarsal pads.  We did discuss referral to podiatry or imaging however we will try above first.  She will follow-up with us  in a couple weeks and we could consider imaging versus referral at that time.    Subjective:  HPI:  See assessment / plan for status of chronic conditions.   Discussed the use of AI scribe software for clinical note transcription with the patient, who gave verbal consent to proceed.  History of Present Illness Yolanda Valdez is a 24 year old female who presents with right foot pain.  She has been experiencing right foot pain for less than two weeks, primarily located in the joint area with occasional radiation to other parts of the foot. The pain is persistent, worsens with walking, and is most pronounced when first placing her feet on the ground in the morning. Initial swelling and redness have subsided, but the pain remains, especially when walking or driving.  She attributes the onset of pain to increased physical activity, including frequent treadmill use and walking for 30-40 minutes daily, which she has since stopped. Despite using a pad from [employer] and [employer], she reports no significant relief. She has also tried ibuprofen , Tylenol , and Motrin  without improvement. She previously used meloxicam  for back pain with good results.  She has a history of foot pain during pregnancy, but this episode is different. Her mother, with a nursing background, suggested the possibility of uric acid involvement, but she has no personal history of gout, although it is present in her family. She wears supportive shoes outside and flats at home, avoiding going barefoot.  No pain in other areas of the foot except when walking  or driving. No pain when pressure is applied to certain areas of the foot.         Objective:  Physical Exam: BP 98/60   Pulse 77   Temp 98.2 F (36.8 C) (Temporal)   Ht 5' 3 (1.6 m)   Wt 130 lb (59 kg)   SpO2 99%   BMI 23.03 kg/m   Gen: No acute distress, resting comfortably MUSCULOSKELETAL - Left Foot: No deformities.  Tenderness palpation along 1st and 2nd metatarsal head.  Pain elicited with lateral squeeze as well though no palpable click.  Neurovascularly intact distally. Neuro: Grossly normal, moves all extremities Psych: Normal affect and thought content      Londyn Wotton M. Kennyth, MD 05/16/2024 2:43 PM  "
